# Patient Record
Sex: Female | Born: 1965
Health system: Southern US, Community
[De-identification: ages and names within clinical notes are randomized; demographics above are authoritative.]

## PROBLEM LIST (undated history)

## (undated) DIAGNOSIS — H548 Legal blindness, as defined in USA: Secondary | ICD-10-CM

## (undated) DIAGNOSIS — H3552 Pigmentary retinal dystrophy: Secondary | ICD-10-CM

## (undated) DIAGNOSIS — I10 Essential (primary) hypertension: Secondary | ICD-10-CM

## (undated) DIAGNOSIS — I4891 Unspecified atrial fibrillation: Secondary | ICD-10-CM

## (undated) DIAGNOSIS — G35 Multiple sclerosis: Secondary | ICD-10-CM

## (undated) DIAGNOSIS — I639 Cerebral infarction, unspecified: Secondary | ICD-10-CM

## (undated) HISTORY — PX: ABDOMINAL HYSTERECTOMY: SHX81

## (undated) HISTORY — PX: CYST REMOVAL NECK: SHX6281

## (undated) HISTORY — DX: Pigmentary retinal dystrophy: H35.52

## (undated) HISTORY — DX: Multiple sclerosis: G35

---

## 1999-11-22 DIAGNOSIS — G35 Multiple sclerosis: Secondary | ICD-10-CM

## 1999-11-22 HISTORY — DX: Multiple sclerosis: G35

## 2005-11-21 DIAGNOSIS — H3552 Pigmentary retinal dystrophy: Secondary | ICD-10-CM

## 2005-11-21 HISTORY — DX: Pigmentary retinal dystrophy: H35.52

## 2006-04-19 ENCOUNTER — Emergency Department: Payer: Self-pay | Admitting: Internal Medicine

## 2015-12-09 ENCOUNTER — Ambulatory Visit: Payer: Medicaid Other | Attending: Neurosurgery

## 2015-12-09 DIAGNOSIS — M5441 Lumbago with sciatica, right side: Secondary | ICD-10-CM | POA: Insufficient documentation

## 2015-12-09 NOTE — Patient Instructions (Signed)
[email or web address]  Prone press up  10x/hr (waking hours) Towel roll behind back when seated to maintain lumbar lordosis.

## 2015-12-09 NOTE — Therapy (Signed)
[facility] MAIN [facility] SERVICES [address] [place], [place], [address] Phone: [phone]   Fax:  [phone]  Physical Therapy Evaluation  Patient Details  Name: [name] MRN: [id number] Date of Birth: Aug 24, 1966 Referring Provider: Lelon Perla  Encounter Date: 12/09/2015      PT End of Session - 12/09/15 1323    Visit Number 1   Number of Visits 9   Date for PT Re-Evaluation 01/06/16   PT Start Time 0810   PT Stop Time 0905   PT Time Calculation (min) 55 min   Activity Tolerance Patient tolerated treatment well   Behavior During Therapy [facility] for tasks assessed/performed      Past Medical History  Diagnosis Date  . Multiple sclerosis (HCC) 2001  . Retinitis pigmentosa 2007    No past surgical history on file.  There were no vitals filed for this visit.  Visit Diagnosis:  Right-sided low back pain with right-sided sciatica - Plan: PT plan of care cert/re-cert      Subjective Assessment - 12/09/15 0824    Subjective pt reports she was moving / lifting things in her closet about 2 months ago. She reports she could not walk the next day due to lower back pain and severe muscle spasms. She went to the ER and they did an MRI showing buldging disc and DDD. Pt reports her pain is a little bit better now. pt reports her R leg pain extends to the middle of the posterior thigh. pt reports she did have numbness/ tingling. she reports no change in bowel or bladder, and no pain into the other leg. pt reports lifting/ pulling seem to aggrevate her leg pain, pt reports sitting bothers her leg pain more. pt reports heat makes her back feel better and lidocane patches. pt reports twisting also worsens her pain.    How long can you sit comfortably? none   How long can you stand comfortably?   Diagnostic tests pt reports buldging disc, DDD and a cystin lumbar spine   Currently in Pain? Yes   Pain Score 8    Pain Location Back   Pain  Orientation Right;Lower   Multiple Pain Sites Yes   Pain Score 8            [facility] PT Assessment - 12/09/15 8756    Assessment   Medical Diagnosis spondylosis, DDD   Referring Provider Lelon Perla   Onset Date/Surgical Date 10/09/15   Precautions   Precautions None   Precaution Comments MS   Restrictions   Weight Bearing Restrictions No   Balance Screen   Has the patient fallen in the past 6 months Yes   How many times? 1   Has the patient had a decrease in activity level because of a fear of falling?  Yes   Is the patient reluctant to leave their home because of a fear of falling?  No   Home Environment   Living Environment Private residence   Living Arrangements Alone   Available Help at Discharge Home health  2 hrs / day   Type of Home Apartment   Home Access Stairs to enter   Entrance Stairs-Number of Steps 3   Entrance Stairs-Rails Right   Home Layout One level   Prior Function   Level of Independence Independent  Jogging 6 mi daily, kick boxing 3x/week   Ambulation/Gait   Assistive device --  white cane   Gait Pattern --  mild  antalgic RLE   Ambulation Surface Level         POSTURE/OBSERVATION:   PROM/AROM: Pt has full lumbar flexion, however peripheralizes and increases LE symptoms, pt needs UE assist to return to standing  extnesion 50% loss without pain L/R sidebending WNL bilaterally with central pain bilaterally   STRENGTH:  Graded on a 0-5 scale Muscle Group Left Right  Shoulder flex    Shoulder Abd    Shoulder Ext    Shoulder IR/ER    Elbow    Wrist/hand     Hip Flex 4 4- (painful)  Hip Abd    Hip Add    Hip Ext    Hip IR/ER    Knee Flex 4 4  Knee Ext 4 4  Ankle DF 5 5  Ankle PF 4 4   SENSATION: WNL of LE  SPECIAL TESTS: (-) slump test, bilaterally FUNCTIONAL MOBILITY:  Pt is independent with bed mobility and transfers, was instructed in log roll to protect the spine.   OUTCOME MEASURES: TEST Outcome Interpretation  5  times sit<>stand 25 sec >60 yo, >15 sec indicates increased risk for falls                    McKenzie assessment: 1xlumar flexion : increased symptoms and peripheralized  posture correction: reduced pain to 6/10 Prone lying reduced pain to 4/10 in lower back and thigh Prone on elbows centralized pain to lower back only at 4/10 Mini prone press up x 10: reduced pain to 0/10 in lower back and RLE.  Pain did not return upon sitting with lumbar support  Extensive pt education regarding posture, lifting mechanics and activity modifications to reduce flexion based activities at this time.                     PT Education - 12/09/15 1322    Education provided Yes   Education Details principles of centralization/peripheralization, HEP, POC,    Person(s) Educated Patient   Methods Explanation   Comprehension Verbalized understanding             PT Long Term Goals - 12/09/15 1329    PT LONG TERM GOAL #1   Title pt will understand HEP and be compliant    Time 4   Period Weeks   Status New               Plan - 12/09/15 1323    Clinical Impression Statement pt presents with 43mo history of lower back pain with R leg pain associated that radiates down the R side. pt does not seem to have sensation changes or motor loss at this time. pt has pain increased and peripheralized with lumbar flexion based activities and responded very well to extension based positioning and exercises in Evaluation today suggesting posterior lumbar derangement with extension preference. pt had centralized LE symptoms and reduced LBP to 0/10 following session today. pt would benefit from continued skilled PT services to further address her symptoms and return to PLOF however pts current insurance plan does not cover PT treatment sessions and pt declines to pay OOP for continued services. pt was recommended to the [facility] , but she is insure if she can get there  due to transportation issue. pt will be DC after todays visit. pt encouraged to call or email with further questions/concerns.    Pt will benefit from skilled therapeutic intervention in order to improve on the  following deficits Pain;Difficulty walking;Decreased range of motion;Improper body mechanics   Rehab Potential Fair   Clinical Impairments Affecting Rehab Potential unable to to follow up with PT due to insurance limitations    PT Frequency 2x / week   PT Duration 4 weeks         Problem List There are no active problems to display for this patient.  Carlyon Shadow. Manasseh Pittsley, PT, DPT [phone]  Travor Royce 12/09/2015, 1:32 PM  [facility] MAIN [facility] SERVICES [address] [place], [place], [address] Phone: [phone]   Fax:  [phone]  Name: [name] MRN: [id number] Date of Birth: June 04, 1966

## 2016-05-30 ENCOUNTER — Emergency Department: Payer: Medicaid Other

## 2016-05-30 ENCOUNTER — Emergency Department
Admission: EM | Admit: 2016-05-30 | Discharge: 2016-05-30 | Disposition: A | Discharge status: Home / Self Care | Payer: Medicaid Other | Attending: Emergency Medicine | Admitting: Emergency Medicine

## 2016-05-30 DIAGNOSIS — S069X9A Unspecified intracranial injury with loss of consciousness of unspecified duration, initial encounter: Secondary | ICD-10-CM | POA: Insufficient documentation

## 2016-05-30 DIAGNOSIS — Y999 Unspecified external cause status: Secondary | ICD-10-CM | POA: Diagnosis not present

## 2016-05-30 DIAGNOSIS — Z79899 Other long term (current) drug therapy: Secondary | ICD-10-CM | POA: Insufficient documentation

## 2016-05-30 DIAGNOSIS — S0990XA Unspecified injury of head, initial encounter: Secondary | ICD-10-CM

## 2016-05-30 DIAGNOSIS — W1800XA Striking against unspecified object with subsequent fall, initial encounter: Secondary | ICD-10-CM | POA: Insufficient documentation

## 2016-05-30 DIAGNOSIS — R55 Syncope and collapse: Secondary | ICD-10-CM

## 2016-05-30 DIAGNOSIS — Y939 Activity, unspecified: Secondary | ICD-10-CM | POA: Diagnosis not present

## 2016-05-30 DIAGNOSIS — F17210 Nicotine dependence, cigarettes, uncomplicated: Secondary | ICD-10-CM | POA: Diagnosis not present

## 2016-05-30 DIAGNOSIS — Y929 Unspecified place or not applicable: Secondary | ICD-10-CM | POA: Insufficient documentation

## 2016-05-30 DIAGNOSIS — E876 Hypokalemia: Secondary | ICD-10-CM | POA: Insufficient documentation

## 2016-05-30 LAB — TROPONIN I: Troponin I: <0.03 ng/mL (ref <0.03)

## 2016-05-30 LAB — BASIC METABOLIC PANEL
ANION GAP: 9 (ref 5–15)
BUN: 23 mg/dL — ABNORMAL HIGH (ref 6–20)
CALCIUM: 9.5 mg/dL (ref 8.9–10.3)
CHLORIDE: 99 mmol/L — AB (ref 101–111)
CO2: 28 mmol/L (ref 22–32)
CREATININE: 0.75 mg/dL (ref 0.44–1.00)
GFR calc non Af Amer: >60 mL/min (ref >60)
Glucose, Bld: 92 mg/dL (ref 65–99)
Potassium: 2.7 mmol/L — CL (ref 3.5–5.1)
SODIUM: 136 mmol/L (ref 135–145)

## 2016-05-30 LAB — CBC
HCT: 41.2 % (ref 35.0–47.0)
HEMOGLOBIN: 14.4 g/dL (ref 12.0–16.0)
MCH: 30.4 pg (ref 26.0–34.0)
MCHC: 34.9 g/dL (ref 32.0–36.0)
MCV: 87.3 fL (ref 80.0–100.0)
PLATELETS: 143 10*3/uL — AB (ref 150–440)
RBC: 4.71 MIL/uL (ref 3.80–5.20)
RDW: 14.5 % (ref 11.5–14.5)
WBC: 4.6 10*3/uL (ref 3.6–11.0)

## 2016-05-30 MED ORDER — SODIUM CHLORIDE 0.9 % IV BOLUS (SEPSIS)
1000.0000 mL | Freq: Once | INTRAVENOUS | Status: AC
  Administered 2016-05-30: 1000 mL via INTRAVENOUS

## 2016-05-30 MED ORDER — POTASSIUM CHLORIDE CRYS ER 20 MEQ PO TBCR
40.0000 meq | EXTENDED_RELEASE_TABLET | Freq: Once | ORAL | Status: AC
  Administered 2016-05-30: 40 meq via ORAL
  Filled 2016-05-30: qty 2

## 2016-05-30 MED ORDER — ONDANSETRON HCL 4 MG/2ML IJ SOLN
4.0000 mg | Freq: Once | INTRAMUSCULAR | Status: AC
  Administered 2016-05-30: 4 mg via INTRAVENOUS
  Filled 2016-05-30: qty 2

## 2016-05-30 MED ORDER — POTASSIUM CHLORIDE ER 10 MEQ PO TBCR: 40.0000 meq | EXTENDED_RELEASE_TABLET | Freq: Every day | ORAL | Status: DC

## 2016-05-30 NOTE — ED Notes (Addendum)
Pt fell yesterday during a syncopal episode that her neighbor noticed - when she fell she hit her head on the dryer and then the floor - after the fall she had to be assisted to her feet and had difficulty walking - since the fall she has had a headache and neck pain - pt reports muscle spasms and weakness in her legs but she also has a dx of MS

## 2016-05-30 NOTE — ED Notes (Signed)
Pt is aware of need for urine sample

## 2016-05-30 NOTE — ED Notes (Addendum)
Pt states she had a syncople episode witnessed by neighbor who states the pt went unconscious and fell back hitting the back of her head on the dryer.. States today she is having intermittent HA with nausea..pt is legally blind

## 2016-05-30 NOTE — ED Notes (Signed)
Lab called with critical K+ of 2.7 - Reported to Dr Pershing Proud

## 2016-05-30 NOTE — ED Provider Notes (Signed)
[facility] Emergency Department Provider Note   ____________________________________________  Time seen: Approximately 510 PM  I have reviewed the triage vital signs and the nursing notes.   HISTORY  Chief Complaint Head Injury and Loss of Consciousness   HPI [name] is a 50 y.o. female with multiple sclerosis as well as blindness who is presenting after syncopal episode yesterday. She says that she passed out after standing up last night. She says that the episode was witnessed by her neighbor who saw her hit the back of her head on a dryer and then hit the ground. The patient does not know how long she was unconscious for. The witness did not tell her that she was seizure. She did not lose bowel or bladder continence. The patient has had lightheadedness ever since. She said the preceding episode she felt lightheaded as well as hot. She said that she has never passed out before. She says that she does have a history of a "heart attack." Denies any chest pain. Denies any shortness of breath.Says that she has been nauseous lately which is not typical for her. Says that she has chronic diarrhea but has been having more than normal. Denies any abdominal pain.   Past Medical History  Diagnosis Date  . Multiple sclerosis ([place]) 2001  . Retinitis pigmentosa 2007    There are no active problems to display for this patient.   Past Surgical History  Procedure Laterality Date  . Abdominal hysterectomy    . Cyst removal neck      spine    Current Outpatient Rx  Name  Route  Sig  Dispense  Refill  . amLODipine (NORVASC) 10 MG tablet   Oral   Take 10 mg by mouth daily.         . clonazePAM (KLONOPIN) 0.5 MG tablet   Oral   Take 0.5 mg by mouth 2 (two) times daily as needed for anxiety.         . docusate sodium (COLACE) 100 MG capsule   Oral   Take 100 mg by mouth 2 (two) times daily.         Marland Kitchen gabapentin (NEURONTIN) 300 MG capsule   Oral  Take 300 mg by mouth 4 (four) times daily.         . hydrochlorothiazide (HYDRODIURIL) 25 MG tablet   Oral   Take 25 mg by mouth daily.         . Interferon Beta-1b (BETASERON) 0.3 MG KIT injection   Subcutaneous   Inject 0.3 mg into the skin every other day.         . meloxicam (MOBIC) 15 MG tablet   Oral   Take 15 mg by mouth daily.         . modafinil (PROVIGIL) 100 MG tablet   Oral   Take 100 mg by mouth 2 (two) times daily.         . potassium chloride (K-DUR,KLOR-CON) 10 MEQ tablet   Oral   Take 10 mEq by mouth once.         . senna (SENOKOT) 8.6 MG tablet   Oral   Take 2 tablets by mouth daily.         Marland Kitchen tiZANidine (ZANAFLEX) 4 MG tablet   Oral   Take 4 mg by mouth every 6 (six) hours as needed for muscle spasms.         . traZODone (DESYREL) 150 MG tablet  Oral   Take 300 mg by mouth at bedtime.            Allergies Carbamazepine  No family history on file.  Social History Social History  Substance Use Topics  . Smoking status: Current Every Day Smoker -- 0.25 packs/day    Types: Cigarettes  . Smokeless tobacco: None  . Alcohol Use: No    Review of Systems Constitutional: No fever/chills Eyes: No visual changes. ENT: No sore throat. Cardiovascular: Denies chest pain. Respiratory: Denies shortness of breath. Gastrointestinal: No abdominal pain.  no vomiting.  No diarrhea.  No constipation. Genitourinary: Negative for dysuria. Musculoskeletal: Negative for back pain. Skin: Negative for rash. Neurological: Negative for headaches, focal weakness or numbness.  10-point ROS otherwise negative.  ____________________________________________   PHYSICAL EXAM:  VITAL SIGNS: ED Triage Vitals  Enc Vitals Group     BP 05/30/16 1827 172/103 mmHg     Pulse Rate 05/30/16 1827 90     Resp 05/30/16 1827 18     Temp 05/30/16 1827 98.3 F (36.8 C)     Temp Source 05/30/16 1827 Oral     SpO2 05/30/16 1827 100 %     Weight 05/30/16  1828 156 lb (70.761 kg)     Height 05/30/16 1828 5' (1.524 m)     Head Cir --      Peak Flow --      Pain Score 05/30/16 1828 8     Pain Loc --      Pain Edu? --      Excl. in [place]? --     Constitutional: Alert and oriented. Well appearing and in no acute distress. Eyes: Conjunctivae are normal. PERRL. EOMI. Head: Atraumatic. Nose: No congestion/rhinnorhea. Mouth/Throat: Mucous membranes are moist.   Neck: No stridor.   Cardiovascular: Normal rate, regular rhythm. Grossly normal heart sounds.  Good peripheral circulation. Respiratory: Normal respiratory effort.  No retractions. Lungs CTAB. Gastrointestinal: Soft with mild diffuse tenderness which the patient says is chronic. No distention. Musculoskeletal: No lower extremity tenderness nor edema.  No joint effusions. Neurologic:  Normal speech and language. No gross focal neurologic deficits are appreciated.  Skin:  Skin is warm, dry and intact. No rash noted. Psychiatric: Mood and affect are normal. Speech and behavior are normal.  ____________________________________________   LABS (all labs ordered are listed, but only abnormal results are displayed)  Labs Reviewed  BASIC METABOLIC PANEL - Abnormal; Notable for the following:    Potassium 2.7 (*)    Chloride 99 (*)    BUN 23 (*)    All other components within normal limits  CBC - Abnormal; Notable for the following:    Platelets 143 (*)    All other components within normal limits  TROPONIN I  TROPONIN I  URINALYSIS COMPLETEWITH MICROSCOPIC ([facility] ONLY)  CBG MONITORING, ED   ____________________________________________  EKG  ED ECG REPORT I, Doran Stabler, the attending physician, personally viewed and interpreted this ECG.   Date: 05/30/2016  EKG Time: 1830  Rate: 77  Rhythm: normal sinus rhythm  Axis: Normal  Intervals:none  ST&T Change: T wave inversions in 1, 2, aVL as well as V3 through V6. Mild ST depression in V4 through V6. No previous for  comparison.  EKG from 2011 with the following read: NORMAL SINUS RHYTHM MINIMAL VOLTAGE CRITERIA FOR LVH, MAY BE NORMAL VARIANT ST& T WAVE ABNORMALITY, CONSIDER INFERIOR ISCHEMIA ST& T WAVE ABNORMALITY, CONSIDER ANTEROLATERAL ISCHEMIA ABNORMAL ECG WHEN COMPARED WITH ECG OF  07-Jan-2010 13:22, NO SIGNIFICANT CHANGE WAS FOUND Confirmed by Tamala Julian  MD, Webb ([id number]) on 03/24/2010 3:39:11 AM  Although I do not have an image to compare to this read appears to be consistent with what I'm seeing on the patient's current EKG. ____________________________________________  RADIOLOGY     CT Head Wo Contrast (Final result) Result time: 05/30/16 21:41:17   Final result by Rad Results In Interface (05/30/16 21:41:17)   Narrative:   CLINICAL DATA: Syncopal episode with fall. Struck back of head. Intermittent headache today with nausea.  EXAM: CT HEAD WITHOUT CONTRAST  TECHNIQUE: Contiguous axial images were obtained from the base of the skull through the vertex without intravenous contrast.  COMPARISON: None.  FINDINGS: Ventricles and sulci appear symmetrical. No ventricular dilatation. No mass effect or midline shift. No abnormal extra-axial fluid collections. Gray-white matter junctions are distinct. Basal cisterns are not effaced. No evidence of acute intracranial hemorrhage. No depressed skull fractures. Visualized paranasal sinuses and mastoid air cells are not opacified.  IMPRESSION: No acute intracranial abnormalities.   Electronically Signed By: Lucienne Capers M.D. On: 05/30/2016 21:41    ____________________________________________   PROCEDURES   Procedures   ____________________________________________   INITIAL IMPRESSION / ASSESSMENT AND PLAN / ED COURSE  Pertinent labs & imaging results that were available during my care of the patient were reviewed by me and considered in my medical decision making (see chart for  details).  ----------------------------------------- 11:35 PM on 05/30/2016 -----------------------------------------  Patient says that she feels relieved after fluids Zofran and potassium. No longer any lightheadedness. Her history is consistent with a vasovagal episode. She says that she was out in the heat for a lot of the day yesterday and she knows that she is not supposed to be on the because of her MS. However, she says that she has been out of the heat for about a month at this time. Given the history as well as her past medical problems. It is likely that she became orthostatic. I will also give her an additional dose potassium to go home with. She will take the prescribed dose tomorrow and then resume her regular standing dose the day after. She understands this plan and is one to comply. She'll be following up with her primary care doctor at [facility] for further evaluation and treatment. Her EKG is abnormal but she has had 2 negative troponins. I was unable to find a picture of her previous EKG. However, the description on the previous record is consistent with what I'm seeing on today's tracing. Patient understands that she should stay out of the heat and stay hydrated. ____________________________________________   FINAL CLINICAL IMPRESSION(S) / ED DIAGNOSES  Syncope. Head injury.    NEW MEDICATIONS STARTED DURING THIS VISIT:  New Prescriptions   No medications on file     Note:  This document was prepared using Dragon voice recognition software and may include unintentional dictation errors.    Orbie Pyo, MD 05/30/16 [phone]

## 2016-12-03 ENCOUNTER — Emergency Department
Admission: EM | Admit: 2016-12-03 | Discharge: 2016-12-03 | Disposition: A | Discharge status: Home / Self Care | Payer: Medicaid Other | Attending: Emergency Medicine | Admitting: Emergency Medicine

## 2016-12-03 ENCOUNTER — Emergency Department: Payer: Medicaid Other

## 2016-12-03 ENCOUNTER — Encounter: Payer: Self-pay | Admitting: Emergency Medicine

## 2016-12-03 DIAGNOSIS — R51 Headache: Secondary | ICD-10-CM | POA: Diagnosis not present

## 2016-12-03 DIAGNOSIS — S161XXA Strain of muscle, fascia and tendon at neck level, initial encounter: Secondary | ICD-10-CM | POA: Insufficient documentation

## 2016-12-03 DIAGNOSIS — Y999 Unspecified external cause status: Secondary | ICD-10-CM | POA: Insufficient documentation

## 2016-12-03 DIAGNOSIS — S199XXA Unspecified injury of neck, initial encounter: Secondary | ICD-10-CM | POA: Diagnosis present

## 2016-12-03 DIAGNOSIS — Y939 Activity, unspecified: Secondary | ICD-10-CM | POA: Insufficient documentation

## 2016-12-03 DIAGNOSIS — Y92481 Parking lot as the place of occurrence of the external cause: Secondary | ICD-10-CM | POA: Insufficient documentation

## 2016-12-03 DIAGNOSIS — F17210 Nicotine dependence, cigarettes, uncomplicated: Secondary | ICD-10-CM | POA: Insufficient documentation

## 2016-12-03 DIAGNOSIS — Z79899 Other long term (current) drug therapy: Secondary | ICD-10-CM | POA: Diagnosis not present

## 2016-12-03 MED ORDER — ACETAMINOPHEN 325 MG PO TABS
650.0000 mg | ORAL_TABLET | Freq: Once | ORAL | Status: AC
  Administered 2016-12-03: 650 mg via ORAL
  Filled 2016-12-03: qty 2
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
## 2017-01-16 ENCOUNTER — Observation Stay (HOSPITAL_COMMUNITY): Payer: Medicaid Other

## 2017-01-16 ENCOUNTER — Emergency Department (HOSPITAL_COMMUNITY): Payer: Medicaid Other

## 2017-01-16 ENCOUNTER — Inpatient Hospital Stay (HOSPITAL_COMMUNITY)
Admission: EM | Admit: 2017-01-16 | Discharge: 2017-01-20 | DRG: 084 | Disposition: A | Discharge status: Rehabilitation Facility | Payer: Medicaid Other | Attending: General Surgery | Admitting: General Surgery

## 2017-01-16 ENCOUNTER — Encounter (HOSPITAL_COMMUNITY): Payer: Self-pay | Admitting: *Deleted

## 2017-01-16 DIAGNOSIS — I10 Essential (primary) hypertension: Secondary | ICD-10-CM | POA: Diagnosis present

## 2017-01-16 DIAGNOSIS — R402241 Coma scale, best verbal response, confused conversation, in the field [EMT or ambulance]: Secondary | ICD-10-CM | POA: Diagnosis present

## 2017-01-16 DIAGNOSIS — S066X9A Traumatic subarachnoid hemorrhage with loss of consciousness of unspecified duration, initial encounter: Principal | ICD-10-CM | POA: Diagnosis present

## 2017-01-16 DIAGNOSIS — H548 Legal blindness, as defined in USA: Secondary | ICD-10-CM

## 2017-01-16 DIAGNOSIS — Z781 Physical restraint status: Secondary | ICD-10-CM

## 2017-01-16 DIAGNOSIS — G8918 Other acute postprocedural pain: Secondary | ICD-10-CM

## 2017-01-16 DIAGNOSIS — K5901 Slow transit constipation: Secondary | ICD-10-CM

## 2017-01-16 DIAGNOSIS — I609 Nontraumatic subarachnoid hemorrhage, unspecified: Secondary | ICD-10-CM

## 2017-01-16 DIAGNOSIS — Z87891 Personal history of nicotine dependence: Secondary | ICD-10-CM

## 2017-01-16 DIAGNOSIS — S025XXA Fracture of tooth (traumatic), initial encounter for closed fracture: Secondary | ICD-10-CM | POA: Diagnosis present

## 2017-01-16 DIAGNOSIS — R413 Other amnesia: Secondary | ICD-10-CM | POA: Diagnosis present

## 2017-01-16 DIAGNOSIS — G35 Multiple sclerosis: Secondary | ICD-10-CM | POA: Diagnosis present

## 2017-01-16 DIAGNOSIS — S82832A Other fracture of upper and lower end of left fibula, initial encounter for closed fracture: Secondary | ICD-10-CM | POA: Diagnosis present

## 2017-01-16 DIAGNOSIS — Y92410 Unspecified street and highway as the place of occurrence of the external cause: Secondary | ICD-10-CM

## 2017-01-16 DIAGNOSIS — S01511A Laceration without foreign body of lip, initial encounter: Secondary | ICD-10-CM

## 2017-01-16 DIAGNOSIS — R402141 Coma scale, eyes open, spontaneous, in the field [EMT or ambulance]: Secondary | ICD-10-CM | POA: Diagnosis present

## 2017-01-16 DIAGNOSIS — R402361 Coma scale, best motor response, obeys commands, in the field [EMT or ambulance]: Secondary | ICD-10-CM | POA: Diagnosis present

## 2017-01-16 DIAGNOSIS — F411 Generalized anxiety disorder: Secondary | ICD-10-CM | POA: Diagnosis present

## 2017-01-16 DIAGNOSIS — J690 Pneumonitis due to inhalation of food and vomit: Secondary | ICD-10-CM

## 2017-01-16 DIAGNOSIS — T1490XA Injury, unspecified, initial encounter: Secondary | ICD-10-CM

## 2017-01-16 HISTORY — DX: Essential (primary) hypertension: I10

## 2017-01-16 HISTORY — DX: Legal blindness, as defined in USA: H54.8

## 2017-01-16 LAB — I-STAT CG4 LACTIC ACID, ED: Lactic Acid, Venous: 2.18 mmol/L (ref 0.5–1.9)

## 2017-01-16 LAB — URINALYSIS, ROUTINE W REFLEX MICROSCOPIC
Bilirubin Urine: NEGATIVE
GLUCOSE, UA: 50 mg/dL — AB
Ketones, ur: NEGATIVE mg/dL
Leukocytes, UA: NEGATIVE
Nitrite: NEGATIVE
PH: 7 (ref 5.0–8.0)
PROTEIN: 30 mg/dL — AB
Specific Gravity, Urine: 1.01 (ref 1.005–1.030)

## 2017-01-16 LAB — COMPREHENSIVE METABOLIC PANEL
ALK PHOS: 69 U/L (ref 38–126)
ALT: 26 U/L (ref 14–54)
AST: 44 U/L — AB (ref 15–41)
Albumin: 4.2 g/dL (ref 3.5–5.0)
Anion gap: 13 (ref 5–15)
BUN: 12 mg/dL (ref 6–20)
CALCIUM: 10.1 mg/dL (ref 8.9–10.3)
CO2: 30 mmol/L (ref 22–32)
Chloride: 97 mmol/L — ABNORMAL LOW (ref 101–111)
Creatinine, Ser: 0.73 mg/dL (ref 0.44–1.00)
GFR calc Af Amer: >60 mL/min (ref >60)
GFR calc non Af Amer: >60 mL/min (ref >60)
GLUCOSE: 104 mg/dL — AB (ref 65–99)
Potassium: 3 mmol/L — ABNORMAL LOW (ref 3.5–5.1)
SODIUM: 140 mmol/L (ref 135–145)
Total Bilirubin: 0.5 mg/dL (ref 0.3–1.2)
Total Protein: 8.1 g/dL (ref 6.5–8.1)

## 2017-01-16 LAB — PROTIME-INR
INR: 0.9
PROTHROMBIN TIME: 12.2 s (ref 11.4–15.2)

## 2017-01-16 LAB — I-STAT CHEM 8, ED
BUN: 13 mg/dL (ref 6–20)
CHLORIDE: 99 mmol/L — AB (ref 101–111)
Calcium, Ion: 1.14 mmol/L — ABNORMAL LOW (ref 1.15–1.40)
Creatinine, Ser: 0.7 mg/dL (ref 0.44–1.00)
GLUCOSE: 103 mg/dL — AB (ref 65–99)
HCT: 45 % (ref 36.0–46.0)
Hemoglobin: 15.3 g/dL — ABNORMAL HIGH (ref 12.0–15.0)
POTASSIUM: 2.9 mmol/L — AB (ref 3.5–5.1)
Sodium: 142 mmol/L (ref 135–145)
TCO2: 34 mmol/L (ref 0–100)

## 2017-01-16 LAB — CBC
HCT: 42.8 % (ref 36.0–46.0)
Hemoglobin: 14.2 g/dL (ref 12.0–15.0)
MCH: 29.4 pg (ref 26.0–34.0)
MCHC: 33.2 g/dL (ref 30.0–36.0)
MCV: 88.6 fL (ref 78.0–100.0)
PLATELETS: 167 10*3/uL (ref 150–400)
RBC: 4.83 MIL/uL (ref 3.87–5.11)
RDW: 13.9 % (ref 11.5–15.5)
WBC: 5 10*3/uL (ref 4.0–10.5)

## 2017-01-16 LAB — SAMPLE TO BLOOD BANK

## 2017-01-16 LAB — I-STAT BETA HCG BLOOD, ED (MC, WL, AP ONLY): HCG, QUANTITATIVE: 9 m[IU]/mL — AB (ref <5)

## 2017-01-16 LAB — POC URINE PREG, ED: PREG TEST UR: NEGATIVE

## 2017-01-16 LAB — ETHANOL

## 2017-01-16 MED ORDER — BISACODYL 10 MG RE SUPP: 10.0000 mg | Freq: Every day | RECTAL | Status: DC | PRN

## 2017-01-16 MED ORDER — ONDANSETRON HCL 4 MG/2ML IJ SOLN
4.0000 mg | Freq: Four times a day (QID) | INTRAMUSCULAR | Status: DC | PRN
  Administered 2017-01-16: 4 mg via INTRAVENOUS
  Filled 2017-01-16: qty 2

## 2017-01-16 MED ORDER — PANTOPRAZOLE SODIUM 40 MG IV SOLR
40.0000 mg | Freq: Every day | INTRAVENOUS | Status: DC
  Administered 2017-01-16 – 2017-01-18 (×3): 40 mg via INTRAVENOUS
  Filled 2017-01-16 (×4): qty 40

## 2017-01-16 MED ORDER — SODIUM CHLORIDE 0.9 % IV SOLN
INTRAVENOUS | Status: DC
  Administered 2017-01-16: 75 mL/h via INTRAVENOUS
  Administered 2017-01-18: 13:00:00 via INTRAVENOUS

## 2017-01-16 MED ORDER — FENTANYL CITRATE (PF) 100 MCG/2ML IJ SOLN
INTRAMUSCULAR | Status: AC | PRN
  Administered 2017-01-16: 50 ug via INTRAVENOUS

## 2017-01-16 MED ORDER — FENTANYL CITRATE (PF) 100 MCG/2ML IJ SOLN
50.0000 ug | INTRAMUSCULAR | Status: DC | PRN
  Administered 2017-01-17: 50 ug via INTRAVENOUS
  Filled 2017-01-16: qty 2

## 2017-01-16 MED ORDER — FENTANYL CITRATE (PF) 100 MCG/2ML IJ SOLN
INTRAMUSCULAR | Status: AC
  Filled 2017-01-16: qty 2

## 2017-01-16 MED ORDER — LIDOCAINE HCL (PF) 1 % IJ SOLN
5.0000 mL | Freq: Once | INTRAMUSCULAR | Status: AC
  Administered 2017-01-16: 5 mL
  Filled 2017-01-16: qty 5

## 2017-01-16 MED ORDER — OXYCODONE HCL 5 MG PO TABS
5.0000 mg | ORAL_TABLET | ORAL | Status: DC | PRN
  Administered 2017-01-20: 5 mg via ORAL
  Filled 2017-01-16: qty 1

## 2017-01-16 MED ORDER — DOCUSATE SODIUM 100 MG PO CAPS
100.0000 mg | ORAL_CAPSULE | Freq: Two times a day (BID) | ORAL | Status: DC
  Administered 2017-01-18 – 2017-01-20 (×5): 100 mg via ORAL
  Filled 2017-01-16 (×5): qty 1

## 2017-01-16 MED ORDER — PANTOPRAZOLE SODIUM 40 MG PO TBEC
40.0000 mg | DELAYED_RELEASE_TABLET | Freq: Every day | ORAL | Status: DC
  Administered 2017-01-19 – 2017-01-20 (×2): 40 mg via ORAL
  Filled 2017-01-16 (×2): qty 1

## 2017-01-16 MED ORDER — ACETAMINOPHEN 325 MG PO TABS: 650.0000 mg | ORAL_TABLET | ORAL | Status: DC | PRN

## 2017-01-16 MED ORDER — MORPHINE SULFATE (PF) 4 MG/ML IV SOLN
1.0000 mg | INTRAVENOUS | Status: DC | PRN
  Administered 2017-01-16 (×2): 2 mg via INTRAVENOUS
  Filled 2017-01-16 (×2): qty 1

## 2017-01-16 MED ORDER — ONDANSETRON HCL 4 MG PO TABS: 4.0000 mg | ORAL_TABLET | Freq: Four times a day (QID) | ORAL | Status: DC | PRN
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
## 2017-01-16 NOTE — ED Notes (Signed)
EDP at bedside suturing mouth.

## 2017-01-16 NOTE — ED Notes (Signed)
Pt to CT and X-ray per Dr Jacqulyn Bath.
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
## 2017-01-16 NOTE — ED Notes (Signed)
TRAUMA END °

## 2017-01-16 NOTE — ED Notes (Signed)
Pt remains increasingly agitated, despite pain meds.  Was sent back from X-ray b/c she would not allow them to take images. Pt attempting to get out of bed, turning on her stomach. Neuro PA paged and responded.

## 2017-01-16 NOTE — Progress Notes (Signed)
   01/16/17 1345  Clinical Encounter Type  Visited With Health care provider  Visit Type ED  Spiritual Encounters  Spiritual Needs Emotional  Stress Factors  Patient Stress Factors Not reviewed  Responded to page. Pt under sedation. Medical staff reported Pt stated no family.

## 2017-01-16 NOTE — ED Notes (Addendum)
I-stat hcg positive.  Pt stated to this RN that she had not menstruated x 2 years.  She stated to radiology staff her last menstrual cycle was last week.  u preg ordered.

## 2017-01-16 NOTE — ED Notes (Signed)
Dr Pool at bedside 
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
## 2017-01-16 NOTE — Progress Notes (Signed)
Received call that patient was agitated. She was seen and examined. Neurologically still intact. Able to move all extremities. Follows commands. PERRL. She is going to be admitted soon. Once on floor, continue neuro checks q 1 hour Repeat scan as planned, sooner as needed.

## 2017-01-16 NOTE — ED Notes (Signed)
Paged Pool/neurosurgery

## 2017-01-16 NOTE — ED Notes (Signed)
Neuro at bedside.

## 2017-01-16 NOTE — ED Notes (Signed)
Dr Wyatt at bedside.  
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
## 2017-01-16 NOTE — ED Notes (Signed)
Neuro surgery at bedside.

## 2017-01-16 NOTE — Progress Notes (Signed)
Pt arrived form ED. Did not want to answer this RN questions. Stated, "I just want to rest". Reviewed Rules of ICU and attempted to orient pt to unit. Family on phone, pt declines this RN to give information out to father.  BF at bedside, did a brief update on pt's condition and plan of care. Orient to unit, belongings, and discussed pt's history .

## 2017-01-17 ENCOUNTER — Encounter (HOSPITAL_COMMUNITY): Payer: Self-pay | Admitting: *Deleted

## 2017-01-17 ENCOUNTER — Observation Stay (HOSPITAL_COMMUNITY): Payer: Medicaid Other

## 2017-01-17 DIAGNOSIS — S069X3S Unspecified intracranial injury with loss of consciousness of 1 hour to 5 hours 59 minutes, sequela: Secondary | ICD-10-CM | POA: Diagnosis not present

## 2017-01-17 DIAGNOSIS — G35 Multiple sclerosis: Secondary | ICD-10-CM | POA: Diagnosis present

## 2017-01-17 DIAGNOSIS — S82832A Other fracture of upper and lower end of left fibula, initial encounter for closed fracture: Secondary | ICD-10-CM | POA: Diagnosis present

## 2017-01-17 DIAGNOSIS — R52 Pain, unspecified: Secondary | ICD-10-CM | POA: Diagnosis not present

## 2017-01-17 DIAGNOSIS — S069X9A Unspecified intracranial injury with loss of consciousness of unspecified duration, initial encounter: Secondary | ICD-10-CM | POA: Diagnosis not present

## 2017-01-17 DIAGNOSIS — N179 Acute kidney failure, unspecified: Secondary | ICD-10-CM | POA: Diagnosis not present

## 2017-01-17 DIAGNOSIS — F411 Generalized anxiety disorder: Secondary | ICD-10-CM | POA: Diagnosis present

## 2017-01-17 DIAGNOSIS — H548 Legal blindness, as defined in USA: Secondary | ICD-10-CM | POA: Diagnosis present

## 2017-01-17 DIAGNOSIS — R402241 Coma scale, best verbal response, confused conversation, in the field [EMT or ambulance]: Secondary | ICD-10-CM | POA: Diagnosis present

## 2017-01-17 DIAGNOSIS — S069X2S Unspecified intracranial injury with loss of consciousness of 31 minutes to 59 minutes, sequela: Secondary | ICD-10-CM | POA: Diagnosis not present

## 2017-01-17 DIAGNOSIS — G441 Vascular headache, not elsewhere classified: Secondary | ICD-10-CM | POA: Diagnosis not present

## 2017-01-17 DIAGNOSIS — S065X9D Traumatic subdural hemorrhage with loss of consciousness of unspecified duration, subsequent encounter: Secondary | ICD-10-CM | POA: Diagnosis not present

## 2017-01-17 DIAGNOSIS — N390 Urinary tract infection, site not specified: Secondary | ICD-10-CM | POA: Diagnosis not present

## 2017-01-17 DIAGNOSIS — A499 Bacterial infection, unspecified: Secondary | ICD-10-CM | POA: Diagnosis not present

## 2017-01-17 DIAGNOSIS — S025XXA Fracture of tooth (traumatic), initial encounter for closed fracture: Secondary | ICD-10-CM | POA: Diagnosis present

## 2017-01-17 DIAGNOSIS — I10 Essential (primary) hypertension: Secondary | ICD-10-CM | POA: Diagnosis present

## 2017-01-17 DIAGNOSIS — I609 Nontraumatic subarachnoid hemorrhage, unspecified: Secondary | ICD-10-CM | POA: Diagnosis present

## 2017-01-17 DIAGNOSIS — K5901 Slow transit constipation: Secondary | ICD-10-CM | POA: Diagnosis not present

## 2017-01-17 DIAGNOSIS — S069X3A Unspecified intracranial injury with loss of consciousness of 1 hour to 5 hours 59 minutes, initial encounter: Secondary | ICD-10-CM | POA: Diagnosis not present

## 2017-01-17 DIAGNOSIS — Z781 Physical restraint status: Secondary | ICD-10-CM | POA: Diagnosis not present

## 2017-01-17 DIAGNOSIS — R402361 Coma scale, best motor response, obeys commands, in the field [EMT or ambulance]: Secondary | ICD-10-CM | POA: Diagnosis present

## 2017-01-17 DIAGNOSIS — Y92410 Unspecified street and highway as the place of occurrence of the external cause: Secondary | ICD-10-CM | POA: Diagnosis not present

## 2017-01-17 DIAGNOSIS — G8918 Other acute postprocedural pain: Secondary | ICD-10-CM | POA: Diagnosis not present

## 2017-01-17 DIAGNOSIS — S066X9A Traumatic subarachnoid hemorrhage with loss of consciousness of unspecified duration, initial encounter: Secondary | ICD-10-CM | POA: Diagnosis present

## 2017-01-17 DIAGNOSIS — S01511A Laceration without foreign body of lip, initial encounter: Secondary | ICD-10-CM | POA: Diagnosis present

## 2017-01-17 DIAGNOSIS — R413 Other amnesia: Secondary | ICD-10-CM | POA: Diagnosis present

## 2017-01-17 DIAGNOSIS — R402141 Coma scale, eyes open, spontaneous, in the field [EMT or ambulance]: Secondary | ICD-10-CM | POA: Diagnosis present

## 2017-01-17 DIAGNOSIS — M792 Neuralgia and neuritis, unspecified: Secondary | ICD-10-CM | POA: Diagnosis not present

## 2017-01-17 DIAGNOSIS — S069X0S Unspecified intracranial injury without loss of consciousness, sequela: Secondary | ICD-10-CM | POA: Diagnosis not present

## 2017-01-17 DIAGNOSIS — Z87891 Personal history of nicotine dependence: Secondary | ICD-10-CM | POA: Diagnosis not present

## 2017-01-17 LAB — GLUCOSE, CAPILLARY: Glucose-Capillary: 111 mg/dL — ABNORMAL HIGH (ref 65–99)

## 2017-01-17 LAB — BASIC METABOLIC PANEL
Anion gap: 9 (ref 5–15)
Anion gap: 9 (ref 5–15)
BUN: 12 mg/dL (ref 6–20)
BUN: 12 mg/dL (ref 6–20)
CALCIUM: 8.8 mg/dL — AB (ref 8.9–10.3)
CHLORIDE: 101 mmol/L (ref 101–111)
CO2: 30 mmol/L (ref 22–32)
CO2: 27 mmol/L (ref 22–32)
CREATININE: 0.57 mg/dL (ref 0.44–1.00)
CREATININE: 0.61 mg/dL (ref 0.44–1.00)
Calcium: 9.3 mg/dL (ref 8.9–10.3)
Chloride: 105 mmol/L (ref 101–111)
GFR calc Af Amer: >60 mL/min (ref >60)
GLUCOSE: 112 mg/dL — AB (ref 65–99)
Glucose, Bld: 108 mg/dL — ABNORMAL HIGH (ref 65–99)
POTASSIUM: 3.2 mmol/L — AB (ref 3.5–5.1)
Potassium: 2.6 mmol/L — CL (ref 3.5–5.1)
SODIUM: 140 mmol/L (ref 135–145)
Sodium: 141 mmol/L (ref 135–145)

## 2017-01-17 LAB — CBC
HCT: 34.6 % — ABNORMAL LOW (ref 36.0–46.0)
HEMOGLOBIN: 11.4 g/dL — AB (ref 12.0–15.0)
MCH: 29 pg (ref 26.0–34.0)
MCHC: 32.9 g/dL (ref 30.0–36.0)
MCV: 88 fL (ref 78.0–100.0)
PLATELETS: 179 10*3/uL (ref 150–400)
RBC: 3.93 MIL/uL (ref 3.87–5.11)
RDW: 13.4 % (ref 11.5–15.5)
WBC: 7.1 10*3/uL (ref 4.0–10.5)

## 2017-01-17 LAB — MRSA PCR SCREENING: MRSA BY PCR: NEGATIVE

## 2017-01-17 MED ORDER — MORPHINE SULFATE (PF) 2 MG/ML IV SOLN
2.0000 mg | INTRAVENOUS | Status: DC | PRN
  Administered 2017-01-17: 4 mg via INTRAVENOUS
  Administered 2017-01-17 (×2): 2 mg via INTRAVENOUS
  Administered 2017-01-18 (×2): 4 mg via INTRAVENOUS
  Filled 2017-01-17 (×5): qty 2

## 2017-01-17 MED ORDER — SODIUM CHLORIDE 0.9 % IV SOLN
30.0000 meq | Freq: Once | INTRAVENOUS | Status: AC
  Administered 2017-01-17: 30 meq via INTRAVENOUS
  Filled 2017-01-17: qty 15

## 2017-01-17 MED ORDER — SODIUM CHLORIDE 0.9 % IV SOLN
30.0000 meq | Freq: Once | INTRAVENOUS | Status: DC
  Filled 2017-01-17: qty 15

## 2017-01-17 MED ORDER — DEXMEDETOMIDINE HCL IN NACL 200 MCG/50ML IV SOLN
0.4000 ug/kg/h | INTRAVENOUS | Status: DC
  Administered 2017-01-17: 1 ug/kg/h via INTRAVENOUS
  Administered 2017-01-17: 0.8 ug/kg/h via INTRAVENOUS
  Administered 2017-01-17: 1.2 ug/kg/h via INTRAVENOUS
  Administered 2017-01-17: 0.9 ug/kg/h via INTRAVENOUS
  Administered 2017-01-17: 0.6 ug/kg/h via INTRAVENOUS
  Administered 2017-01-18 (×2): 1 ug/kg/h via INTRAVENOUS
  Administered 2017-01-18: 0.4 ug/kg/h via INTRAVENOUS
  Administered 2017-01-18: 1 ug/kg/h via INTRAVENOUS
  Filled 2017-01-17 (×10): qty 50

## 2017-01-17 MED ORDER — BOOST / RESOURCE BREEZE PO LIQD
1.0000 | Freq: Three times a day (TID) | ORAL | Status: DC
  Administered 2017-01-18 – 2017-01-20 (×3): 1 via ORAL

## 2017-01-17 NOTE — Progress Notes (Signed)
Pt sitting up in bed, attempting to get up. Pt states "I have to pee". Attempt to have pt use female urinal and bedpan. Pt continues to attempt to get OOB. Re-orient pt, with no success.  Dr. Doylene Canard called. Notified of pts increase in agitation. Discussed this RN's assessment, previous agitation, and report from ED. Unable to take down for stat head CT d/t pt being noncompliant/cooperative. Discussed little known history per family.  Order for Posey to help keep pt in bed. Also to give 2-4mg  morphine, IV, Q2h PRN and attempt head scan.
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
## 2017-01-17 NOTE — Progress Notes (Signed)
Inpatient Rehabilitation  OT has evaluated pt. And is recommending IP Rehab.  Patient was screened by Weldon Picking for appropriateness for an Inpatient Acute Rehab consult.  At this time, we are recommending Inpatient Rehab consult.  Please order consult if you are agreeable.  Weldon Picking PT Inpatient Rehab Admissions Coordinator Cell [phone] Office [phone]
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
## 2017-01-17 NOTE — Progress Notes (Signed)
Orthopedic Tech Progress Note Patient Details:  [name] [birth year] [id number]  Patient ID: [name], female   DOB: [birth year], 51 y.o.   MRN: [id number]   [name] 01/17/2017, 10:32 AM Called in bio-tech brace order; spoke with Wylene Men
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
## 2017-01-18 MED ORDER — CLONAZEPAM 0.5 MG PO TABS
0.5000 mg | ORAL_TABLET | Freq: Two times a day (BID) | ORAL | Status: DC
  Administered 2017-01-18 – 2017-01-20 (×5): 0.5 mg via ORAL
  Filled 2017-01-18 (×5): qty 1

## 2017-01-18 MED ORDER — MORPHINE SULFATE (PF) 2 MG/ML IV SOLN
2.0000 mg | INTRAVENOUS | Status: DC | PRN
  Administered 2017-01-18 (×2): 2 mg via INTRAVENOUS
  Administered 2017-01-19 (×2): 4 mg via INTRAVENOUS
  Filled 2017-01-18: qty 1
  Filled 2017-01-18: qty 2
  Filled 2017-01-18: qty 1
  Filled 2017-01-18: qty 2

## 2017-01-18 MED ORDER — WHITE PETROLATUM GEL
Status: AC
  Administered 2017-01-18: 08:00:00
  Filled 2017-01-18: qty 1
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
## 2017-01-19 DIAGNOSIS — I609 Nontraumatic subarachnoid hemorrhage, unspecified: Secondary | ICD-10-CM

## 2017-01-19 DIAGNOSIS — S069X9A Unspecified intracranial injury with loss of consciousness of unspecified duration, initial encounter: Secondary | ICD-10-CM

## 2017-01-19 DIAGNOSIS — S82832A Other fracture of upper and lower end of left fibula, initial encounter for closed fracture: Secondary | ICD-10-CM

## 2017-01-19 MED ORDER — MELOXICAM 7.5 MG PO TABS
15.0000 mg | ORAL_TABLET | Freq: Every day | ORAL | Status: DC
  Administered 2017-01-19 – 2017-01-20 (×2): 15 mg via ORAL
  Filled 2017-01-19 (×2): qty 2

## 2017-01-19 MED ORDER — HYDROCHLOROTHIAZIDE 25 MG PO TABS
25.0000 mg | ORAL_TABLET | Freq: Every day | ORAL | Status: DC
  Administered 2017-01-19 – 2017-01-20 (×2): 25 mg via ORAL
  Filled 2017-01-19 (×2): qty 1

## 2017-01-19 MED ORDER — AMLODIPINE BESYLATE 10 MG PO TABS
10.0000 mg | ORAL_TABLET | Freq: Every day | ORAL | Status: DC
  Administered 2017-01-19 – 2017-01-20 (×2): 10 mg via ORAL
  Filled 2017-01-19 (×2): qty 1

## 2017-01-19 MED ORDER — CYCLOBENZAPRINE HCL 5 MG PO TABS
5.0000 mg | ORAL_TABLET | Freq: Three times a day (TID) | ORAL | Status: DC | PRN
  Administered 2017-01-19: 5 mg via ORAL
  Filled 2017-01-19: qty 1

## 2017-01-19 MED ORDER — VITAMIN E 45 MG (100 UNIT) PO CAPS
1000.0000 [IU] | ORAL_CAPSULE | Freq: Every day | ORAL | Status: DC
  Administered 2017-01-19 – 2017-01-20 (×2): 1000 [IU] via ORAL
  Filled 2017-01-19 (×2): qty 2

## 2017-01-19 MED ORDER — INTERFERON BETA-1B 0.3 MG ~~LOC~~ KIT: 0.3000 mg | PACK | SUBCUTANEOUS | Status: DC

## 2017-01-19 MED ORDER — DOCUSATE SODIUM 100 MG PO CAPS: 100.0000 mg | ORAL_CAPSULE | Freq: Every day | ORAL | Status: DC | PRN

## 2017-01-19 MED ORDER — SENNA 8.6 MG PO TABS: 1.0000 | ORAL_TABLET | Freq: Every day | ORAL | Status: DC | PRN

## 2017-01-19 MED ORDER — TRAZODONE HCL 150 MG PO TABS
300.0000 mg | ORAL_TABLET | Freq: Every day | ORAL | Status: DC
  Administered 2017-01-19: 300 mg via ORAL
  Filled 2017-01-19: qty 2

## 2017-01-19 MED ORDER — POTASSIUM CHLORIDE CRYS ER 20 MEQ PO TBCR
40.0000 meq | EXTENDED_RELEASE_TABLET | Freq: Every day | ORAL | Status: DC
  Administered 2017-01-19 – 2017-01-20 (×2): 40 meq via ORAL
  Filled 2017-01-19 (×2): qty 2

## 2017-01-19 MED ORDER — GABAPENTIN 300 MG PO CAPS
900.0000 mg | ORAL_CAPSULE | Freq: Four times a day (QID) | ORAL | Status: DC
  Administered 2017-01-19 – 2017-01-20 (×4): 900 mg via ORAL
  Filled 2017-01-19 (×4): qty 3

## 2017-01-19 MED ORDER — TIZANIDINE HCL 2 MG PO TABS: 4.0000 mg | ORAL_TABLET | Freq: Four times a day (QID) | ORAL | Status: DC | PRN

## 2017-01-19 NOTE — Progress Notes (Signed)
Pt transferred to the unit. Pt is alert per baseline. Oriented to room, staff, and call bell as much as possible. Educated to call for any assistance. Bed in lowest position, call bell within reach- will continue to monitor. Avis at bedside
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
## 2017-01-19 NOTE — Progress Notes (Signed)
Pt transported via wheelchair to 6N. Telephone report given to [facility] Water engineer. Pt transported with RN and NT with tele. Stable at discharge. Belongins with black shoes, socks, and purse sent with patient. Other valuables remain in security possession.

## 2017-01-19 NOTE — Progress Notes (Signed)
Patient continues to try to get out of bed without assistance. Patient also will NOT leave tele box on. She is taking it off as soon as it is reapplied [employer], RN

## 2017-01-19 NOTE — Progress Notes (Signed)
Pt constantly trying to get out of bed. Bed alarm on, also tele sitter monitoring pt.. Pt will not leave tele leads on. I have requested 1:1 sitter, but none available.
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
## 2017-01-20 ENCOUNTER — Inpatient Hospital Stay (HOSPITAL_COMMUNITY)
Admission: RE | Admit: 2017-01-20 | Payer: No Typology Code available for payment source | Source: Intra-hospital | Admitting: Physical Medicine & Rehabilitation

## 2017-01-20 ENCOUNTER — Inpatient Hospital Stay (HOSPITAL_COMMUNITY)
Admission: RE | Admit: 2017-01-20 | Discharge: 2017-02-02 | DRG: 945 | Disposition: A | Discharge status: Skilled Nursing Facility | Payer: Medicaid Other | Source: Intra-hospital | Attending: Physical Medicine & Rehabilitation | Admitting: Physical Medicine & Rehabilitation

## 2017-01-20 DIAGNOSIS — S82832D Other fracture of upper and lower end of left fibula, subsequent encounter for closed fracture with routine healing: Secondary | ICD-10-CM

## 2017-01-20 DIAGNOSIS — S82832A Other fracture of upper and lower end of left fibula, initial encounter for closed fracture: Secondary | ICD-10-CM | POA: Diagnosis not present

## 2017-01-20 DIAGNOSIS — R52 Pain, unspecified: Secondary | ICD-10-CM | POA: Diagnosis not present

## 2017-01-20 DIAGNOSIS — G441 Vascular headache, not elsewhere classified: Secondary | ICD-10-CM

## 2017-01-20 DIAGNOSIS — Z91018 Allergy to other foods: Secondary | ICD-10-CM

## 2017-01-20 DIAGNOSIS — M792 Neuralgia and neuritis, unspecified: Secondary | ICD-10-CM

## 2017-01-20 DIAGNOSIS — B964 Proteus (mirabilis) (morganii) as the cause of diseases classified elsewhere: Secondary | ICD-10-CM | POA: Diagnosis not present

## 2017-01-20 DIAGNOSIS — F419 Anxiety disorder, unspecified: Secondary | ICD-10-CM | POA: Diagnosis present

## 2017-01-20 DIAGNOSIS — H548 Legal blindness, as defined in USA: Secondary | ICD-10-CM

## 2017-01-20 DIAGNOSIS — I609 Nontraumatic subarachnoid hemorrhage, unspecified: Secondary | ICD-10-CM

## 2017-01-20 DIAGNOSIS — I10 Essential (primary) hypertension: Secondary | ICD-10-CM | POA: Diagnosis present

## 2017-01-20 DIAGNOSIS — S069X3A Unspecified intracranial injury with loss of consciousness of 1 hour to 5 hours 59 minutes, initial encounter: Secondary | ICD-10-CM | POA: Diagnosis not present

## 2017-01-20 DIAGNOSIS — S069X0S Unspecified intracranial injury without loss of consciousness, sequela: Secondary | ICD-10-CM | POA: Diagnosis not present

## 2017-01-20 DIAGNOSIS — S065X9D Traumatic subdural hemorrhage with loss of consciousness of unspecified duration, subsequent encounter: Secondary | ICD-10-CM | POA: Diagnosis present

## 2017-01-20 DIAGNOSIS — K5900 Constipation, unspecified: Secondary | ICD-10-CM | POA: Diagnosis present

## 2017-01-20 DIAGNOSIS — N179 Acute kidney failure, unspecified: Secondary | ICD-10-CM

## 2017-01-20 DIAGNOSIS — F1410 Cocaine abuse, uncomplicated: Secondary | ICD-10-CM | POA: Diagnosis present

## 2017-01-20 DIAGNOSIS — S069X3S Unspecified intracranial injury with loss of consciousness of 1 hour to 5 hours 59 minutes, sequela: Secondary | ICD-10-CM

## 2017-01-20 DIAGNOSIS — Z79899 Other long term (current) drug therapy: Secondary | ICD-10-CM

## 2017-01-20 DIAGNOSIS — Z888 Allergy status to other drugs, medicaments and biological substances status: Secondary | ICD-10-CM | POA: Diagnosis not present

## 2017-01-20 DIAGNOSIS — N390 Urinary tract infection, site not specified: Secondary | ICD-10-CM | POA: Diagnosis not present

## 2017-01-20 DIAGNOSIS — K5901 Slow transit constipation: Secondary | ICD-10-CM

## 2017-01-20 DIAGNOSIS — G35 Multiple sclerosis: Secondary | ICD-10-CM | POA: Diagnosis present

## 2017-01-20 DIAGNOSIS — S066X9A Traumatic subarachnoid hemorrhage with loss of consciousness of unspecified duration, initial encounter: Principal | ICD-10-CM

## 2017-01-20 DIAGNOSIS — G8918 Other acute postprocedural pain: Secondary | ICD-10-CM

## 2017-01-20 DIAGNOSIS — R4189 Other symptoms and signs involving cognitive functions and awareness: Secondary | ICD-10-CM | POA: Diagnosis present

## 2017-01-20 DIAGNOSIS — F411 Generalized anxiety disorder: Secondary | ICD-10-CM

## 2017-01-20 DIAGNOSIS — S069X2S Unspecified intracranial injury with loss of consciousness of 31 minutes to 59 minutes, sequela: Secondary | ICD-10-CM | POA: Diagnosis not present

## 2017-01-20 LAB — BASIC METABOLIC PANEL
ANION GAP: 12 (ref 5–15)
BUN: 10 mg/dL (ref 6–20)
CHLORIDE: 103 mmol/L (ref 101–111)
CO2: 27 mmol/L (ref 22–32)
Calcium: 9.2 mg/dL (ref 8.9–10.3)
Creatinine, Ser: 1.37 mg/dL — ABNORMAL HIGH (ref 0.44–1.00)
GFR, EST AFRICAN AMERICAN: 50 mL/min — AB (ref >60)
GFR, EST NON AFRICAN AMERICAN: 43 mL/min — AB (ref >60)
Glucose, Bld: 143 mg/dL — ABNORMAL HIGH (ref 65–99)
POTASSIUM: 3.5 mmol/L (ref 3.5–5.1)
SODIUM: 142 mmol/L (ref 135–145)

## 2017-01-20 MED ORDER — BISACODYL 10 MG RE SUPP
10.0000 mg | Freq: Every day | RECTAL | Status: DC | PRN
  Administered 2017-01-25: 10 mg via RECTAL
  Filled 2017-01-20: qty 1

## 2017-01-20 MED ORDER — OXYCODONE HCL 5 MG PO TABS
5.0000 mg | ORAL_TABLET | ORAL | Status: DC | PRN
  Administered 2017-01-21 – 2017-01-27 (×13): 5 mg via ORAL
  Filled 2017-01-20 (×13): qty 1

## 2017-01-20 MED ORDER — GABAPENTIN 300 MG PO CAPS
900.0000 mg | ORAL_CAPSULE | Freq: Four times a day (QID) | ORAL | Status: DC
  Administered 2017-01-20 – 2017-01-21 (×4): 900 mg via ORAL
  Filled 2017-01-20 (×4): qty 3

## 2017-01-20 MED ORDER — AMLODIPINE BESYLATE 10 MG PO TABS
10.0000 mg | ORAL_TABLET | Freq: Every day | ORAL | Status: DC
  Administered 2017-01-21 – 2017-02-02 (×13): 10 mg via ORAL
  Filled 2017-01-20 (×13): qty 1

## 2017-01-20 MED ORDER — VITAMIN E 180 MG (400 UNIT) PO CAPS
1000.0000 [IU] | ORAL_CAPSULE | Freq: Every day | ORAL | Status: DC
  Administered 2017-01-21 – 2017-02-02 (×13): 1000 [IU] via ORAL
  Filled 2017-01-20 (×13): qty 2

## 2017-01-20 MED ORDER — ONDANSETRON HCL 4 MG PO TABS: 4.0000 mg | ORAL_TABLET | Freq: Four times a day (QID) | ORAL | Status: DC | PRN

## 2017-01-20 MED ORDER — WHITE PETROLATUM GEL
Status: AC
  Filled 2017-01-20: qty 1

## 2017-01-20 MED ORDER — DOCUSATE SODIUM 100 MG PO CAPS
100.0000 mg | ORAL_CAPSULE | Freq: Two times a day (BID) | ORAL | Status: DC
  Administered 2017-01-20 – 2017-02-02 (×25): 100 mg via ORAL
  Filled 2017-01-20 (×27): qty 1

## 2017-01-20 MED ORDER — POTASSIUM CHLORIDE CRYS ER 20 MEQ PO TBCR
40.0000 meq | EXTENDED_RELEASE_TABLET | Freq: Every day | ORAL | Status: DC
  Administered 2017-01-21 – 2017-02-02 (×13): 40 meq via ORAL
  Filled 2017-01-20 (×13): qty 2

## 2017-01-20 MED ORDER — ACETAMINOPHEN 325 MG PO TABS
650.0000 mg | ORAL_TABLET | ORAL | Status: DC | PRN
  Administered 2017-01-21 – 2017-02-01 (×4): 650 mg via ORAL
  Filled 2017-01-20 (×5): qty 2

## 2017-01-20 MED ORDER — BOOST / RESOURCE BREEZE PO LIQD
1.0000 | Freq: Three times a day (TID) | ORAL | Status: DC
  Administered 2017-01-20 – 2017-02-02 (×30): 1 via ORAL

## 2017-01-20 MED ORDER — INTERFERON BETA-1B 0.3 MG ~~LOC~~ KIT
0.3000 mg | PACK | SUBCUTANEOUS | Status: DC
  Administered 2017-01-21 – 2017-02-02 (×7): 0.3 mg via SUBCUTANEOUS
  Filled 2017-01-20 (×11): qty 0.3

## 2017-01-20 MED ORDER — CLONAZEPAM 0.5 MG PO TABS
0.5000 mg | ORAL_TABLET | Freq: Two times a day (BID) | ORAL | Status: DC
  Administered 2017-01-20 – 2017-02-02 (×26): 0.5 mg via ORAL
  Filled 2017-01-20 (×26): qty 1

## 2017-01-20 MED ORDER — ONDANSETRON HCL 4 MG/2ML IJ SOLN: 4.0000 mg | Freq: Four times a day (QID) | INTRAMUSCULAR | Status: DC | PRN

## 2017-01-20 MED ORDER — PANTOPRAZOLE SODIUM 40 MG PO TBEC
40.0000 mg | DELAYED_RELEASE_TABLET | Freq: Every day | ORAL | Status: DC
  Administered 2017-01-21 – 2017-02-02 (×13): 40 mg via ORAL
  Filled 2017-01-20 (×13): qty 1

## 2017-01-20 MED ORDER — CYCLOBENZAPRINE HCL 5 MG PO TABS
5.0000 mg | ORAL_TABLET | Freq: Three times a day (TID) | ORAL | Status: DC | PRN
  Administered 2017-01-25: 5 mg via ORAL
  Filled 2017-01-20: qty 1

## 2017-01-20 MED ORDER — HYDROCHLOROTHIAZIDE 25 MG PO TABS
25.0000 mg | ORAL_TABLET | Freq: Every day | ORAL | Status: DC
  Administered 2017-01-21 – 2017-02-02 (×13): 25 mg via ORAL
  Filled 2017-01-20 (×13): qty 1

## 2017-01-20 MED ORDER — SORBITOL 70 % SOLN
30.0000 mL | Freq: Every day | Status: DC | PRN
  Administered 2017-01-22 – 2017-01-24 (×2): 30 mL via ORAL
  Filled 2017-01-20 (×2): qty 30

## 2017-01-20 MED ORDER — TRAZODONE HCL 50 MG PO TABS
300.0000 mg | ORAL_TABLET | Freq: Every day | ORAL | Status: DC
  Administered 2017-01-20 – 2017-02-01 (×13): 300 mg via ORAL
  Filled 2017-01-20 (×13): qty 6

## 2017-01-20 MED ORDER — TIZANIDINE HCL 4 MG PO TABS
4.0000 mg | ORAL_TABLET | Freq: Four times a day (QID) | ORAL | Status: DC | PRN
  Filled 2017-01-20: qty 1
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
## 2017-01-20 NOTE — Progress Notes (Signed)
Pt's sister Oneta Rack was notified of discharge to [facility] room 26.

## 2017-01-20 NOTE — Progress Notes (Signed)
Discharged pt to Inpt rehab (4 west) Rm 26 via wheelchair. Pt is calm and cooperative upon discharge.

## 2017-01-20 NOTE — Progress Notes (Signed)
Rehab admissions - I met with patient and her boyfriend, [name], at the bedside.  They are both in agreement to inpatient rehab prior to discharge home.  [name] confirms to me that patient will have supervision after discharge and will not be left alone.  Patient has been cleared by trauma service for inpatient rehab admission today.  Bed available and will admit to acute inpatient rehab today.  Call me for questions.  #[phone]
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
## 2017-01-21 ENCOUNTER — Inpatient Hospital Stay (HOSPITAL_COMMUNITY): Payer: Medicaid Other | Admitting: Physical Therapy

## 2017-01-21 ENCOUNTER — Inpatient Hospital Stay (HOSPITAL_COMMUNITY): Payer: Medicaid Other | Admitting: Occupational Therapy

## 2017-01-21 ENCOUNTER — Inpatient Hospital Stay (HOSPITAL_COMMUNITY): Payer: Medicaid Other | Admitting: Speech Pathology

## 2017-01-21 DIAGNOSIS — M792 Neuralgia and neuritis, unspecified: Secondary | ICD-10-CM

## 2017-01-21 DIAGNOSIS — H548 Legal blindness, as defined in USA: Secondary | ICD-10-CM

## 2017-01-21 DIAGNOSIS — G35 Multiple sclerosis: Secondary | ICD-10-CM

## 2017-01-21 DIAGNOSIS — R52 Pain, unspecified: Secondary | ICD-10-CM

## 2017-01-21 DIAGNOSIS — N179 Acute kidney failure, unspecified: Secondary | ICD-10-CM

## 2017-01-21 DIAGNOSIS — S069X0S Unspecified intracranial injury without loss of consciousness, sequela: Secondary | ICD-10-CM

## 2017-01-21 DIAGNOSIS — F411 Generalized anxiety disorder: Secondary | ICD-10-CM

## 2017-01-21 DIAGNOSIS — I609 Nontraumatic subarachnoid hemorrhage, unspecified: Secondary | ICD-10-CM

## 2017-01-21 MED ORDER — GABAPENTIN 300 MG PO CAPS
900.0000 mg | ORAL_CAPSULE | Freq: Three times a day (TID) | ORAL | Status: DC
  Administered 2017-01-22 – 2017-02-02 (×34): 900 mg via ORAL
  Filled 2017-01-21 (×34): qty 3
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
## 2017-01-21 NOTE — Progress Notes (Signed)
[facility] PHYSICAL MEDICINE & REHABILITATION     PROGRESS NOTE  Subjective/Complaints:  Pt seen laying in bed this AM.  She slept well overnight.  She is still sleepy, but ready for therapies.   ROS: Denies CP, SOB, N/V/D.  Objective: Vital Signs: Blood pressure (!) 136/92, pulse 92, temperature 99.3 F (37.4 C), temperature source Oral, resp. rate 17, height 5' (1.524 m), weight 68.8 kg (151 lb 9.6 oz), SpO2 99 %. No results found. No results for input(s): WBC, HGB, HCT, PLT in the last 72 hours.  Recent Labs  01/20/17 0526  NA 142  K 3.5  CL 103  GLUCOSE 143*  BUN 10  CREATININE 1.37*  CALCIUM 9.2   CBG (last 3)  No results for input(s): GLUCAP in the last 72 hours.  Wt Readings from Last 3 Encounters:  01/20/17 68.8 kg (151 lb 9.6 oz)  01/16/17 63.8 kg (140 lb 10.5 oz)    Physical Exam:  BP (!) 136/92 (BP Location: Left Arm)   Pulse 92   Temp 99.3 F (37.4 C) (Oral)   Resp 17   Ht 5' (1.524 m)   Wt 68.8 kg (151 lb 9.6 oz)   LMP  (LMP Unknown)   SpO2 99%   BMI 29.61 kg/m  Constitutional: She appears well-developed and well-nourished.  HENT: Right facial edema with healing incisions  Eyes: EOM are normal. No discharge. She can see some shadows. Injected right sclera  Cardiovascular: Normal rate and regular rhythm.  No JVD. Respiratory: Effort normal and breath sounds normal.  GI: Soft. Bowel sounds are normal.  Musculoskeletal: No edema. Tenderness RLE Neurological: A&Ox2 Sensation intact to light touch Motor: 5/5 bilateral deltoid, bicep, tricep, grip, right hip flexion, knee extension, ankle dorsiflexion, plantarflexion. RLE: HF, KE 4-/5, 5/5 ADF/PF LLE: HF, NE 2+/5, ADF/PF 4-/5  Skin: Skin is warm and dry.   Assessment/Plan: 1. Functional deficits secondary to cognitive deficits to TBI/traumatic SDH, left fibular fracture which require 3+ hours per day of interdisciplinary therapy in a comprehensive inpatient rehab setting. Physiatrist is providing  close team supervision and 24 hour management of active medical problems listed below. Physiatrist and rehab team continue to assess barriers to discharge/monitor patient progress toward functional and medical goals.  Function:  Bathing Bathing position   Position: Wheelchair/chair at sink  Bathing parts Body parts bathed by patient: Right arm, Left arm, Chest, Abdomen, Front perineal area, Buttocks, Right upper leg Body parts bathed by helper: Right lower leg, Back  Bathing assist Assist Level: Touching or steadying assistance(Pt > 75%)      Upper Body Dressing/Undressing Upper body dressing   What is the patient wearing?: Pull over shirt/dress     Pull over shirt/dress - Perfomed by patient: Thread/unthread right sleeve, Thread/unthread left sleeve, Pull shirt over trunk Pull over shirt/dress - Perfomed by helper: Put head through opening        Upper body assist Assist Level: Touching or steadying assistance(Pt > 75%)      Lower Body Dressing/Undressing Lower body dressing   What is the patient wearing?: Pants, Non-skid slipper socks, Underwear Underwear - Performed by patient: Thread/unthread right underwear leg, Pull underwear up/down Underwear - Performed by helper: Thread/unthread left underwear leg Pants- Performed by patient: Thread/unthread right pants leg, Pull pants up/down Pants- Performed by helper: Thread/unthread left pants leg Non-skid slipper socks- Performed by patient: Don/doff right sock Non-skid slipper socks- Performed by helper: Don/doff left sock  Lower body assist Assist for lower body dressing: Touching or steadying assistance (Pt > 75%)      Toileting Toileting Toileting activity did not occur: No continent bowel/bladder event Toileting steps completed by patient: Adjust clothing prior to toileting, Performs perineal hygiene, Adjust clothing after toileting   Toileting Assistive Devices: Grab bar or rail  Toileting assist  Assist level: Touching or steadying assistance (Pt.75%)   Transfers Chair/bed transfer   Chair/bed transfer method: Stand pivot, Ambulatory Chair/bed transfer assist level: Touching or steadying assistance (Pt > 75%)       Locomotion Ambulation     Max distance: 100 Assist level: Touching or steadying assistance (Pt > 75%)   Wheelchair   Type: Manual Max wheelchair distance: 150 Assist Level: Dependent (Pt equals 0%)  Cognition Comprehension Comprehension assist level: Understands basic 90% of the time/cues < 10% of the time  Expression Expression assist level: Expresses basic 90% of the time/requires cueing < 10% of the time.  Social Interaction Social Interaction assist level: Interacts appropriately 25 - 49% of time - Needs frequent redirection.  Problem Solving Problem solving assist level: Solves basic 50 - 74% of the time/requires cueing 25 - 49% of the time  Memory Memory assist level: Recognizes or recalls 50 - 74% of the time/requires cueing 25 - 49% of the time    Medical Problem List and Plan: 1.  Decreased functional mobility secondary to cognitive deficits to TBI/traumatic SDH, left fibular fracture-weightbearing as tolerated with Bledsoe brace  Begin CIR 2.  DVT Prophylaxis/Anticoagulation: SCDs. Monitor for any signs of DVT 3. Pain Management: Neurontin decreased to 900 mg TID, Mobic 15 mg daily, Trazodone 300 mg daily at bedtime Oxycodone as needed, Flexeril and Zanaflex as needed 4. Mood: Klonopin 0.5 mg twice a day 5. Neuropsych: This patient is capable of making decisions on her own behalf. 6. Skin/Wound Care: Routine skin checks 7. Fluids/Electrolytes/Nutrition: Routine I&Os 8. Legally blind. Patient can see shadows only 9. Hypertension. Norvasc 10 mg daily, HCTZ 25 mg daily  Monitor with increased mobility 10. History of multiple sclerosis. Continue Betaseron as directed 11. Constipation. Laxative assistance 12. AKI  Cr. 1.37 on 3/2  Encourage  fluids  LOS (Days) 1 A FACE TO FACE EVALUATION WAS PERFORMED  Alasia Enge Karis Juba 01/21/2017 10:08 PM
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
## 2017-01-22 DIAGNOSIS — G441 Vascular headache, not elsewhere classified: Secondary | ICD-10-CM

## 2017-01-22 MED ORDER — TOPIRAMATE 25 MG PO TABS
25.0000 mg | ORAL_TABLET | Freq: Every day | ORAL | Status: DC
  Administered 2017-01-22 – 2017-01-27 (×6): 25 mg via ORAL
  Filled 2017-01-22 (×6): qty 1

## 2017-01-22 NOTE — Progress Notes (Signed)
Patient got out of bed to "go wash my face." Reminded patient that she needs to call for assistance. She stated she did not know she could not get up alone. Expressed frustration with this expectation, but verbalized agreement. Left patient washing up with NT at the sink.

## 2017-01-22 NOTE — Progress Notes (Signed)
[place] PHYSICAL MEDICINE & REHABILITATION     PROGRESS NOTE  Subjective/Complaints:  Pt seen laying in bed this AM.  She slept well overnight and had a good first day in therapies.  Per nursing, pt with headache.  ROS: Denies CP, SOB, N/V/D.  Objective: Vital Signs: Blood pressure 136/83, pulse 99, temperature 98.3 F (36.8 C), temperature source Oral, resp. rate 16, height 5' (1.524 m), weight 68.8 kg (151 lb 9.6 oz), SpO2 99 %. No results found. No results for input(s): WBC, HGB, HCT, PLT in the last 72 hours.  Recent Labs  01/20/17 0526  NA 142  K 3.5  CL 103  GLUCOSE 143*  BUN 10  CREATININE 1.37*  CALCIUM 9.2   CBG (last 3)  No results for input(s): GLUCAP in the last 72 hours.  Wt Readings from Last 3 Encounters:  01/20/17 68.8 kg (151 lb 9.6 oz)  01/16/17 63.8 kg (140 lb 10.5 oz)    Physical Exam:  BP 136/83   Pulse 99   Temp 98.3 F (36.8 C) (Oral)   Resp 16   Ht 5' (1.524 m)   Wt 68.8 kg (151 lb 9.6 oz)   LMP  (LMP Unknown)   SpO2 99%   BMI 29.61 kg/m  Constitutional: She appears well-developed and well-nourished.  HENT: Right facial edema with healing incisions Eyes: EOM are normal. No discharge. She can see some shadows. Injected right sclera  Cardiovascular: Normal rate and regular rhythm.  No JVD. Respiratory: Effort normal and breath sounds normal.  GI: Soft. Bowel sounds are normal.  Musculoskeletal: No edema. Tenderness RLE Neurological: A&Ox2 Sensation intact to light touch Motor: 5/5 bilateral deltoid, bicep, tricep, grip, right hip flexion, knee extension, ankle dorsiflexion, plantarflexion. RLE: HF, KE 4-/5, 5/5 ADF/PF LLE: HF, NE 3/5, ADF/PF 3+/5  Skin: Skin is warm and dry.   Assessment/Plan: 1. Functional deficits secondary to cognitive deficits to TBI/traumatic SDH, left fibular fracture which require 3+ hours per day of interdisciplinary therapy in a comprehensive inpatient rehab setting. Physiatrist is providing close team  supervision and 24 hour management of active medical problems listed below. Physiatrist and rehab team continue to assess barriers to discharge/monitor patient progress toward functional and medical goals.  Function:  Bathing Bathing position   Position: Wheelchair/chair at sink  Bathing parts Body parts bathed by patient: Right arm, Left arm, Chest, Abdomen, Front perineal area, Buttocks, Right upper leg Body parts bathed by helper: Right lower leg, Back  Bathing assist Assist Level: Touching or steadying assistance(Pt > 75%)      Upper Body Dressing/Undressing Upper body dressing   What is the patient wearing?: Pull over shirt/dress     Pull over shirt/dress - Perfomed by patient: Thread/unthread right sleeve, Thread/unthread left sleeve, Pull shirt over trunk Pull over shirt/dress - Perfomed by helper: Put head through opening        Upper body assist Assist Level: Touching or steadying assistance(Pt > 75%)      Lower Body Dressing/Undressing Lower body dressing   What is the patient wearing?: Pants, Non-skid slipper socks, Underwear Underwear - Performed by patient: Thread/unthread right underwear leg, Pull underwear up/down Underwear - Performed by helper: Thread/unthread left underwear leg Pants- Performed by patient: Thread/unthread right pants leg, Pull pants up/down Pants- Performed by helper: Thread/unthread left pants leg Non-skid slipper socks- Performed by patient: Don/doff right sock Non-skid slipper socks- Performed by helper: Don/doff left sock  Lower body assist Assist for lower body dressing: Touching or steadying assistance (Pt > 75%)      Toileting Toileting Toileting activity did not occur: No continent bowel/bladder event Toileting steps completed by patient: Adjust clothing prior to toileting, Performs perineal hygiene, Adjust clothing after toileting   Toileting Assistive Devices: Grab bar or rail  Toileting assist Assist  level: Touching or steadying assistance (Pt.75%)   Transfers Chair/bed transfer   Chair/bed transfer method: Stand pivot, Ambulatory Chair/bed transfer assist level: Touching or steadying assistance (Pt > 75%)       Locomotion Ambulation     Max distance: 100 Assist level: Touching or steadying assistance (Pt > 75%)   Wheelchair   Type: Manual Max wheelchair distance: 150 Assist Level: Dependent (Pt equals 0%)  Cognition Comprehension Comprehension assist level: Understands basic 90% of the time/cues < 10% of the time  Expression Expression assist level: Expresses basic 90% of the time/requires cueing < 10% of the time.  Social Interaction Social Interaction assist level: Interacts appropriately 25 - 49% of time - Needs frequent redirection.  Problem Solving Problem solving assist level: Solves basic 50 - 74% of the time/requires cueing 25 - 49% of the time  Memory Memory assist level: Recognizes or recalls 50 - 74% of the time/requires cueing 25 - 49% of the time    Medical Problem List and Plan: 1.  Decreased functional mobility secondary to cognitive deficits to TBI/traumatic SDH, left fibular fracture-weightbearing as tolerated with Bledsoe brace  Cont CIR 2.  DVT Prophylaxis/Anticoagulation: SCDs. Monitor for any signs of DVT 3. Pain Management: Neurontin decreased to 900 mg TID, Mobic 15 mg daily, Trazodone 300 mg daily at bedtime Oxycodone as needed, Flexeril and Zanaflex as needed  Topamax 25 daily started 3/4 for headaches 4. Mood: Klonopin 0.5 mg twice a day 5. Neuropsych: This patient is capable of making decisions on her own behalf. 6. Skin/Wound Care: Routine skin checks 7. Fluids/Electrolytes/Nutrition: Routine I&Os 8. Legally blind. Patient can see shadows only 9. Hypertension. Norvasc 10 mg daily, HCTZ 25 mg daily  Controlled 3/4 10. History of multiple sclerosis. Continue Betaseron as directed 11. Constipation. Laxative assistance 12. AKI  Cr. 1.37 on  3/2  Encourage fluids  LOS (Days) 2 A FACE TO FACE EVALUATION WAS PERFORMED  Raseel Jans Karis Juba 01/22/2017 8:41 AM

## 2017-01-23 ENCOUNTER — Inpatient Hospital Stay (HOSPITAL_COMMUNITY): Payer: Medicaid Other | Admitting: Physical Therapy

## 2017-01-23 ENCOUNTER — Inpatient Hospital Stay (HOSPITAL_COMMUNITY): Payer: Medicaid Other

## 2017-01-23 ENCOUNTER — Inpatient Hospital Stay (HOSPITAL_COMMUNITY): Payer: Medicaid Other | Admitting: Speech Pathology

## 2017-01-23 DIAGNOSIS — S069X2S Unspecified intracranial injury with loss of consciousness of 31 minutes to 59 minutes, sequela: Secondary | ICD-10-CM

## 2017-01-23 LAB — CBC WITH DIFFERENTIAL/PLATELET
Basophils Absolute: 0.1 10*3/uL (ref 0.0–0.1)
Basophils Relative: 1 %
Eosinophils Absolute: 0.1 10*3/uL (ref 0.0–0.7)
Eosinophils Relative: 1 %
HCT: 33.5 % — ABNORMAL LOW (ref 36.0–46.0)
Hemoglobin: 11.2 g/dL — ABNORMAL LOW (ref 12.0–15.0)
Lymphocytes Relative: 37 %
Lymphs Abs: 2.1 10*3/uL (ref 0.7–4.0)
MCH: 29.9 pg (ref 26.0–34.0)
MCHC: 33.4 g/dL (ref 30.0–36.0)
MCV: 89.3 fL (ref 78.0–100.0)
Monocytes Absolute: 0.5 10*3/uL (ref 0.1–1.0)
Monocytes Relative: 9 %
Neutro Abs: 2.9 10*3/uL (ref 1.7–7.7)
Neutrophils Relative %: 52 %
Platelets: 226 10*3/uL (ref 150–400)
RBC: 3.75 MIL/uL — ABNORMAL LOW (ref 3.87–5.11)
RDW: 14.1 % (ref 11.5–15.5)
WBC: 5.7 10*3/uL (ref 4.0–10.5)

## 2017-01-23 LAB — COMPREHENSIVE METABOLIC PANEL
ALT: 26 U/L (ref 14–54)
AST: 39 U/L (ref 15–41)
Albumin: 3.1 g/dL — ABNORMAL LOW (ref 3.5–5.0)
Alkaline Phosphatase: 44 U/L (ref 38–126)
Anion gap: 6 (ref 5–15)
BUN: 14 mg/dL (ref 6–20)
CO2: 29 mmol/L (ref 22–32)
Calcium: 9.3 mg/dL (ref 8.9–10.3)
Chloride: 103 mmol/L (ref 101–111)
Creatinine, Ser: 0.74 mg/dL (ref 0.44–1.00)
GFR calc Af Amer: >60 mL/min (ref >60)
GFR calc non Af Amer: >60 mL/min (ref >60)
Glucose, Bld: 106 mg/dL — ABNORMAL HIGH (ref 65–99)
Potassium: 3.6 mmol/L (ref 3.5–5.1)
Sodium: 138 mmol/L (ref 135–145)
Total Bilirubin: 0.6 mg/dL (ref 0.3–1.2)
Total Protein: 6.8 g/dL (ref 6.5–8.1)

## 2017-01-23 NOTE — IPOC Note (Signed)
Overall Plan of Care [facility]) Patient Details Name: JULLIAN PREVITI MRN: [id number] DOB: 05-12-1966  Admitting Diagnosis: TBI L FIB FX  Hospital Problems: Active Problems:   SAH (subarachnoid hemorrhage) (HCC)   Traumatic brain injury (HCC)   Pain   Neuropathic pain   MS (multiple sclerosis) (HCC)   AKI (acute kidney injury) (HCC)   Vascular headache     Functional Problem List: Nursing Pain, Endurance, Medication Management, Skin Integrity, Safety  PT Balance, Safety, Endurance, Motor, Pain, Perception, Behavior  OT Balance, Cognition, Endurance, Motor, Pain, Safety, Vision (vision impairment baseline)  SLP Cognition  TR         Basic ADL's: OT Eating, Grooming, Bathing, Dressing, Toileting     Advanced  ADL's: OT Simple Meal Preparation     Transfers: PT Bed Mobility, Bed to Chair, Car, Occupational psychologist, Research scientist (life sciences): PT Stairs, Psychologist, prison and probation services, Ambulation     Additional Impairments: OT None  SLP Social Cognition   Problem Solving, Memory, Attention, Awareness  TR      Anticipated Outcomes Item Anticipated Outcome  Self Feeding Mod I  Swallowing      Basic self-care  Mod I  Toileting  Mod I   Bathroom Transfers Supervision  Bowel/Bladder  n/a  Transfers  supervision  Locomotion  supervision  Communication     Cognition  Supervision with basic   Pain  Manage pain at or below level 5 with prn medications  Safety/Judgment  Maintain safety with supervision/cues   Therapy Plan: PT Intensity: Minimum of 1-2 x/day ,45 to 90 minutes PT Frequency: 5 out of 7 days PT Duration Estimated Length of Stay: 14-18 days OT Intensity: Minimum of 1-2 x/day, 45 to 90 minutes OT Frequency: 5 out of 7 days OT Duration/Estimated Length of Stay: 14-18 days SLP Intensity: Minumum of 1-2 x/day, 30 to 90 minutes SLP Frequency: 3 to 5 out of 7 days SLP Duration/Estimated Length of Stay: 10-14 days        Team Interventions: Nursing Interventions  Patient/Family Education, Skin Care/Wound Management, Discharge Planning, Pain Management, Cognitive Remediation/Compensation, Psychosocial Support, Medication Management  PT interventions Ambulation/gait training, Community reintegration, DME/adaptive equipment instruction, Neuromuscular re-education, Psychosocial support, Stair training, UE/LE Strength taining/ROM, Wheelchair propulsion/positioning, UE/LE Coordination activities, Therapeutic Activities, Warden/ranger, Discharge planning, Cognitive remediation/compensation, Disease management/prevention, Functional mobility training, Patient/family education, Splinting/orthotics, Therapeutic Exercise  OT Interventions Warden/ranger, Cognitive remediation/compensation, Community reintegration, Discharge planning, DME/adaptive equipment instruction, Functional mobility training, Pain management, Patient/family education, Self Care/advanced ADL retraining, Psychosocial support, Therapeutic Activities, Therapeutic Exercise, UE/LE Coordination activities, UE/LE Strength taining/ROM, Visual/perceptual remediation/compensation  SLP Interventions Cognitive remediation/compensation, Cueing hierarchy, Environmental controls, Functional tasks, Internal/external aids, Patient/family education, Therapeutic Activities  TR Interventions    SW/CM Interventions Discharge Planning, Psychosocial Support, Patient/Family Education    Team Discharge Planning: Destination: PT-Home ,OT- Home , SLP-Home Projected Follow-up: PT-Home health PT, 24 hour supervision/assistance, OT-  Home health OT, SLP-24 hour supervision/assistance, Outpatient SLP Projected Equipment Needs: PT-To be determined, OT- To be determined, SLP-None recommended by SLP Equipment Details: PT- , OT-  Patient/family involved in discharge planning: PT- Patient,  OT-Patient, SLP-Patient  MD ELOS: 14 days Medical Rehab Prognosis:  Excellent Assessment: The patient has been  admitted for CIR therapies with the diagnosis of TBI, left fibular fx. The team will be addressing functional mobility, strength, stamina, balance, safety, adaptive techniques and equipment, self-care, bowel and bladder mgt, patient and caregiver education, NMR, cognitive perceptual rx, pain control, ortho precautions, orthotics, behavioral  mgt, ego support. Goals have been set at mod I to supervision with mobiltiy and self-care and supervision with cognition.    Ranelle Oyster, MD, FAAPMR      See Team Conference Notes for weekly updates to the plan of care
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
## 2017-01-23 NOTE — Progress Notes (Signed)
Occupational Therapy Session Note  Patient Details  Name: [name] MRN: [id number] Date of Birth: 11/19/1966  Today's Date: 01/23/2017 OT Individual Time: 1300-1356 OT Individual Time Calculation (min): 56 min    Short Term Goals: Week 1:  OT Short Term Goal 1 (Week 1): Pt will don pants using reacher and Min A OT Short Term Goal 2 (Week 1): Pt will tolerate 5 minutes standing at the sink in preparation for ADL tasks OT Short Term Goal 3 (Week 1): Pt will sequence 3 step ADL with min verbal cues  Skilled Therapeutic Interventions/Progress Updates:    Pt resting in bed upon arrival.  Pt required tot A for orientation to place and situation.  Pt inquired if she was in a loft and how many steps to get into her loft.  Pt accepted explanation and continued with therapy.  Pt had not eaten lunch, stating that she didn't want the hamburger.  Pt agreeable to yogurt and completed eating with setup to open container.  Pt required min A for bed mobility and tot A for donning LLE Bledsoe.  Pt does not have any clothing and declined bathing this afternoon but agreeable to taking a shower tomorrow (PA approved). Pt performed sit<>stand X 5 and c/o increased pain when standing which was relieved when returned to EOB.  Pt's conversation was tangential throughout session requiring mod verbal cues to redirect to conversation.  Pt returned to bed with all needs within reach and bed alarm activated.  Telesitter activated.    Therapy Documentation Precautions:  Precautions Precautions: Fall Precaution Comments: Pt is visually impaired at baseline Required Braces or Orthoses: Other Brace/Splint Other Brace/Splint: Hinged knee brace- unrestricted AROM at L knee Restrictions Weight Bearing Restrictions: Yes LLE Weight Bearing: Weight bearing as tolerated Other Position/Activity Restrictions: Hinged Knee Brace Pain:  Pt denied pain  See Function Navigator for Current Functional Status.   Therapy/Group:  Individual Therapy  Rich Brave 01/23/2017, 1:58 PM
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
## 2017-01-23 NOTE — Progress Notes (Signed)
Speech Language Pathology Daily Session Note  Patient Details  Name: [name] MRN: [id number] Date of Birth: 1966-03-19  Today's Date: 01/23/2017 SLP Individual Time: 0830-0930 SLP Individual Time Calculation (min): 60 min  Short Term Goals: Week 1: SLP Short Term Goal 1 (Week 1): Patient will sustain attention to functional tasks for 15-20 minutes Min assist cues for redirection  SLP Short Term Goal 2 (Week 1): Patient will solve basic problems related to self-care with Min assist verbal cues  SLP Short Term Goal 3 (Week 1): Patient will identify 2 physical and 2 cognitive deficits post injury SLP Short Term Goal 4 (Week 1): Patient will utilize external aids and/or memory strategies to recall daily and new information with Mod assist verbal cues   Skilled Therapeutic Interventions: Skilled treatment session focused on cognitive goals. SLP facilitated session by providing supervision verbal cues for recall of her current medications and their functions and total A for reassurance that she is receiving medication appropraitely (reported she did not get her sleeping pill last night). Patient required Max A for intellectual of physical and cognitive deficits and their impact on her overall function. Patient also required Max A verbal cues for recall of information after a 5 minute delay. Patient left upright in bed with all needs within reach and alarm on. Continue with current plan of care.      Function:    Cognition Comprehension Comprehension assist level: Understands basic 50 - 74% of the time/ requires cueing 25 - 49% of the time  Expression   Expression assist level: Expresses basic 50 - 74% of the time/requires cueing 25 - 49% of the time. Needs to repeat parts of sentences.  Social Interaction Social Interaction assist level: Interacts appropriately 25 - 49% of time - Needs frequent redirection.  Problem Solving Problem solving assist level: Solves basic 50 - 74% of the  time/requires cueing 25 - 49% of the time  Memory Memory assist level: Recognizes or recalls less than 25% of the time/requires cueing greater than 75% of the time    Pain No/Denies Pain   Therapy/Group: Individual Therapy  [name] 01/23/2017, 9:32 AM
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
## 2017-01-24 ENCOUNTER — Inpatient Hospital Stay (HOSPITAL_COMMUNITY): Payer: Medicaid Other | Admitting: Occupational Therapy

## 2017-01-24 ENCOUNTER — Inpatient Hospital Stay (HOSPITAL_COMMUNITY): Payer: Medicaid Other

## 2017-01-24 ENCOUNTER — Inpatient Hospital Stay (HOSPITAL_COMMUNITY): Payer: Medicaid Other | Admitting: Physical Therapy

## 2017-01-24 ENCOUNTER — Inpatient Hospital Stay (HOSPITAL_COMMUNITY): Payer: Medicaid Other | Admitting: Speech Pathology

## 2017-01-24 DIAGNOSIS — S069X3S Unspecified intracranial injury with loss of consciousness of 1 hour to 5 hours 59 minutes, sequela: Secondary | ICD-10-CM

## 2017-01-24 MED ORDER — NICOTINE 7 MG/24HR TD PT24
7.0000 mg | MEDICATED_PATCH | Freq: Every day | TRANSDERMAL | Status: DC
  Administered 2017-01-24 – 2017-02-02 (×10): 7 mg via TRANSDERMAL
  Filled 2017-01-24 (×10): qty 1
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
## 2017-01-24 NOTE — Progress Notes (Signed)
Speech Language Pathology Daily Session Note  Patient Details  Name: [name] MRN: [id number] Date of Birth: Feb 27, 1966  Today's Date: 01/24/2017 SLP Individual Time: 4098-1191 SLP Individual Time Calculation (min): 55 min  Short Term Goals: Week 1: SLP Short Term Goal 1 (Week 1): Patient will sustain attention to functional tasks for 15-20 minutes Min assist cues for redirection  SLP Short Term Goal 2 (Week 1): Patient will solve basic problems related to self-care with Min assist verbal cues  SLP Short Term Goal 3 (Week 1): Patient will identify 2 physical and 2 cognitive deficits post injury SLP Short Term Goal 4 (Week 1): Patient will utilize external aids and/or memory strategies to recall daily and new information with Mod assist verbal cues   Skilled Therapeutic Interventions: Skilled treatment session focused on cognitive goals. SLP facilitated session by providing Max A multimodal cues for intellectual awareness of deficits and their impact on her overall function and safety. Patient perseverative on paying bills and smoking a cigarette and required Max verbal cues for redirection to task for ~5 minute intervals. Patient also performed self-care tasks at the sink with Mod verbal cues due to visual impairments. Patient left upright in wheelchair with quick release belt in place and RN present. Continue with current plan of care.      Function:   Cognition Comprehension Comprehension assist level: Understands basic 50 - 74% of the time/ requires cueing 25 - 49% of the time  Expression   Expression assist level: Expresses basic 50 - 74% of the time/requires cueing 25 - 49% of the time. Needs to repeat parts of sentences.  Social Interaction Social Interaction assist level: Interacts appropriately 25 - 49% of time - Needs frequent redirection.  Problem Solving Problem solving assist level: Solves basic 50 - 74% of the time/requires cueing 25 - 49% of the time  Memory Memory assist  level: Recognizes or recalls less than 25% of the time/requires cueing greater than 75% of the time    Pain No/Denies Pain   Therapy/Group: Individual Therapy  Herb Beltre 01/24/2017, 3:28 PM

## 2017-01-24 NOTE — Progress Notes (Signed)
Occupational Therapy Session Note  Patient Details  Name: [name] MRN: [id number] Date of Birth: Nov 10, 1966  Today's Date: 01/24/2017 OT Individual Time: 1100-1158 OT Individual Time Calculation (min): 58 min    Short Term Goals: Week 1:  OT Short Term Goal 1 (Week 1): Pt will don pants using reacher and Min A OT Short Term Goal 2 (Week 1): Pt will tolerate 5 minutes standing at the sink in preparation for ADL tasks OT Short Term Goal 3 (Week 1): Pt will sequence 3 step ADL with min verbal cues  Skilled Therapeutic Interventions/Progress Updates:    Pt resting in bed upon arrival.  Pt requested to take a shower.  Discussed with pt that her Bledsoe brace would need to remain on her LLE and would be covered with a plastic bag.  Pt agreeable.  Pt transferred to w/c with steady A to locate w/c.  Pt requested to use toilet and transferred with supervision.  Pt amb with HHA from toilet to tub bench for shower.  Pt required assistance to bathe L foot without AE but completed remainder of bathing without assistance.  Pt donned underpants, shirt, and socks without assistance.  Pt donned hospital gown 2/2 pt does not have any clothing.  Pt applied body lotion and returned to bed with all needs within reach and bed alarm activated.  Focus on functional transfers, sit<>stand, functional amb without AD, standing balance, and safety awareness to increase independence with BADLs.   Therapy Documentation Precautions:  Precautions Precautions: Fall Precaution Comments: Pt is visually impaired at baseline Required Braces or Orthoses: Other Brace/Splint Other Brace/Splint: Hinged knee brace- unrestricted AROM at L knee Restrictions Weight Bearing Restrictions: Yes LLE Weight Bearing: Weight bearing as tolerated Other Position/Activity Restrictions: Hinged Knee Brace General:   Vital Signs: Therapy Vitals BP: 119/60 Pain: Pain Assessment Pain Assessment: 0-10 Pain Score: 3  Pain Type: Chronic  pain Pain Location: Head Pain Descriptors / Indicators: Aching Pain Frequency: Constant Pain Onset: On-going Patients Stated Pain Goal: 4 Pain Intervention(s): Medication (See eMAR) ADL: ADL ADL Comments: Please see functional navigator Exercises:   Other Treatments:    See Function Navigator for Current Functional Status.   Therapy/Group: Individual Therapy  Rich Brave 01/24/2017, 12:06 PM

## 2017-01-24 NOTE — Plan of Care (Signed)
Problem: RH Eating Goal: LTG Patient will perform eating w/assist, cues/equip (OT) LTG: Patient will perform eating with assist, with/without cues using equipment (OT)  Goal downgraded to supervision for safety- ESD 01/24/17  Problem: RH Grooming Goal: LTG Patient will perform grooming w/assist,cues/equip (OT) LTG: Patient will perform grooming with assist, with/without cues using equipment (OT)  Goal downgraded to supervision for safety- ESD 01/24/17  Problem: RH Bathing Goal: LTG Patient will bathe with assist, cues/equipment (OT) LTG: Patient will bathe specified number of body parts with assist with/without cues using equipment (position)  (OT)  Goal downgraded to supervision for safety- ESD 01/24/17  Problem: RH Dressing Goal: LTG Patient will perform upper body dressing (OT) LTG Patient will perform upper body dressing with assist, with/without cues (OT).  Goal downgraded to supervision for safety- ESD 01/24/17 Goal: LTG Patient will perform lower body dressing w/assist (OT) LTG: Patient will perform lower body dressing with assist, with/without cues in positioning using equipment (OT)  Goal downgraded to supervision for safety- ESD 01/24/17  Problem: RH Toileting Goal: LTG Patient will perform toileting w/assist, cues/equip (OT) LTG: Patient will perform toiletiing (clothes management/hygiene) with assist, with/without cues using equipment (OT)  Goal downgraded to supervision for safety- ESD 01/24/17  Comments: Goal downgraded to supervision for safety- ESD 01/24/17

## 2017-01-24 NOTE — Progress Notes (Signed)
Physical Therapy Session Note  Patient Details  Name: [name] MRN: [id number] Date of Birth: Sep 05, 1966  Today's Date: 01/24/2017 PT Individual Time: 1300-1400 PT Individual Time Calculation (min): 60 min   Short Term Goals: Week 1:  PT Short Term Goal 1 (Week 1): pt will attend to functional task in distracting environment x10 minutes with min verbal cues.   PT Short Term Goal 2 (Week 1): Pt will demonstrate recall of previous PT session with mod question cues PT Short Term Goal 3 (Week 1): Pt will demonstrate dynamic standing balance in unfamiliar environment with min assist.   Skilled Therapeutic Interventions/Progress Updates:    Session focused on functional mobility in home environment and community environment with cognitive remediation to address attention, memory, awareness, safety, and orientation. Mod to max cues for cognition throughout session with patient perseverating on getting her money from "the safe" and wanting to "get my cigarette." Pt able to be redirected but continues to be tangential throughout session. Pt completed basic transfers during session including toileting with overall steadying assist and manual and verbal cues for obstacle negotiation due to visual impairment. In community setting, in gift shop, completed at min assist ambulatory level with focus on creating "a list" of items she wanted to purchase with PT writing down price and working on patient recalling items and relative prices during activity with max assist. Pt propelled w/c on unit with min physical assist and total cues for obstacle negotiation due to visual impairment.   Therapy Documentation Precautions:  Precautions Precautions: Fall Precaution Comments: Pt is visually impaired at baseline Required Braces or Orthoses: Other Brace/Splint Other Brace/Splint: Hinged knee brace- unrestricted AROM at L knee Restrictions Weight Bearing Restrictions: Yes LLE Weight Bearing: Weight bearing as  tolerated Other Position/Activity Restrictions: Hinged Knee Brace   Pain: Reports intermittent discomfort in LLE - pt able to state she fractured that leg.   See Function Navigator for Current Functional Status.   Therapy/Group: Individual Therapy  Karolee Stamps Darrol Poke, PT, DPT  01/24/2017, 2:07 PM

## 2017-01-24 NOTE — Progress Notes (Signed)
Occupational Therapy Session Note  Patient Details  Name: [name] MRN: [id number] Date of Birth: [birth year]  Today's Date: 01/24/2017 OT Individual Time: 0930-1000 and 1415-1445  OT Individual Time Calculation (min): 30 min and 30 min    Short Term Goals: Week 1:  OT Short Term Goal 1 (Week 1): Pt will don pants using reacher and Min A OT Short Term Goal 2 (Week 1): Pt will tolerate 5 minutes standing at the sink in preparation for ADL tasks OT Short Term Goal 3 (Week 1): Pt will sequence 3 step ADL with min verbal cues  Skilled Therapeutic Interventions/Progress Updates:   Session 1: Upon entering the room, pt seated in wheelchair with 3/10 pain described as headache. Pt reports recently receiving medications. Pt oriented to self and location. She was also able to correctly state month and year. Pt unable to verbalize why she was at hospital or what events happened prior to admission. Pt states, " I don't know what happened to me." Pt very focused on calling attorney and needing max cues to redirect conversation. Tangential speech throughout session. Pt unable to demonstrate how to call RN. OT utilized gauze to build up call button in order to allow pt to easily locate secondary to visual deficits. Pt demonstrated ability to use call bell. Pt remained in wheelchair with quick release belt donned and call bell within reach.   Session 2: Upon entering the room, pt seated in wheelchair with no c/o pain this session. Pt perseverating on items she found in gift shop and wishes to purchase needing mod cues to redirect. Pt requesting to don pants. Pt attempting to thread onto L LE but needing assistance. She stood from wheelchair with steady assistance. Steady balance assistance when standing to pull pants over B hips. Pt removed hospital gown as it was soiled and she had shirt on underneath. Pt remained seated in wheelchair with quick release belt donned and soft call bell within reach upon exiting  the room.   Therapy Documentation Precautions:  Precautions Precautions: Fall Precaution Comments: Pt is visually impaired at baseline Required Braces or Orthoses: Other Brace/Splint Other Brace/Splint: Hinged knee brace- unrestricted AROM at L knee Restrictions Weight Bearing Restrictions: Yes LLE Weight Bearing: Weight bearing as tolerated Other Position/Activity Restrictions: Hinged Knee Brace General:   Vital Signs: Therapy Vitals BP: 119/60 Pain: Pain Assessment Pain Assessment: 0-10 Pain Score: 3  Pain Type: Chronic pain Pain Location: Head Pain Descriptors / Indicators: Aching Pain Frequency: Constant Pain Onset: On-going Patients Stated Pain Goal: 4 Pain Intervention(s): Medication (See eMAR) ADL: ADL ADL Comments: Please see functional navigator Exercises:   Other Treatments:    See Function Navigator for Current Functional Status.   Therapy/Group: Individual Therapy  Alen Bleacher 01/24/2017, 12:24 PM

## 2017-01-24 NOTE — Progress Notes (Signed)
Patient information reviewed and entered into [facility] system by Kiyona Mcnall, RN, CRRN, PPS Coordinator.  Information including medical coding and functional independence measure will be reviewed and updated through discharge.    

## 2017-01-25 ENCOUNTER — Inpatient Hospital Stay (HOSPITAL_COMMUNITY): Payer: Medicaid Other

## 2017-01-25 ENCOUNTER — Inpatient Hospital Stay (HOSPITAL_COMMUNITY): Payer: Medicaid Other | Admitting: Occupational Therapy

## 2017-01-25 ENCOUNTER — Inpatient Hospital Stay (HOSPITAL_COMMUNITY): Payer: Medicaid Other | Admitting: Speech Pathology

## 2017-01-25 ENCOUNTER — Inpatient Hospital Stay (HOSPITAL_COMMUNITY): Payer: Medicaid Other | Admitting: Physical Therapy

## 2017-01-25 NOTE — Progress Notes (Signed)
Speech Language Pathology Daily Session Note  Patient Details  Name: [name] MRN: [id number] Date of Birth: Aug 11, 1966  Today's Date: 01/25/2017 SLP Individual Time: 0815-0900 SLP Individual Time Calculation (min): 45 min  Short Term Goals: Week 1: SLP Short Term Goal 1 (Week 1): Patient will sustain attention to functional tasks for 15-20 minutes Min assist cues for redirection  SLP Short Term Goal 2 (Week 1): Patient will solve basic problems related to self-care with Min assist verbal cues  SLP Short Term Goal 3 (Week 1): Patient will identify 2 physical and 2 cognitive deficits post injury SLP Short Term Goal 4 (Week 1): Patient will utilize external aids and/or memory strategies to recall daily and new information with Mod assist verbal cues   Skilled Therapeutic Interventions: Skilled treatment session focused on cognitive goals. Throughout session, patient extremely confused wit intermittent confabulation and paranoia. SLP facilitated session by verbalizing timeline of current hospitalization and events and providing Max A verbal cues for emergent awareness of deficits and their impact on her function.  Patient required frequent redirection throughout session and demonstrated sustained attention to topic of conversation for ~ 2 turns. Patient left upright in wheelchair with all needs within reach. Continue with current plan of care.      Function:   Cognition Comprehension Comprehension assist level: Understands basic 50 - 74% of the time/ requires cueing 25 - 49% of the time  Expression   Expression assist level: Expresses basic 50 - 74% of the time/requires cueing 25 - 49% of the time. Needs to repeat parts of sentences.  Social Interaction Social Interaction assist level: Interacts appropriately 25 - 49% of time - Needs frequent redirection.  Problem Solving Problem solving assist level: Solves basic 25 - 49% of the time - needs direction more than half the time to initiate,  plan or complete simple activities  Memory Memory assist level: Recognizes or recalls less than 25% of the time/requires cueing greater than 75% of the time    Pain No/Denies Pain   Therapy/Group: Individual Therapy  [name] 01/25/2017, 3:09 PM
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
## 2017-01-25 NOTE — Progress Notes (Signed)
Physical Therapy Note  Patient Details  Name: [name] MRN: [id number] Date of Birth: Mar 16, 1966 Today's Date: 01/25/2017  0905-1005, 60 min individual tx Pain: "hurts a lot when I move it" (LLE)  PT cleaned pt's glasses and she wore them intermittently, stating they are for close vision, but removing them often.  .Pt oriented to month, year, situation. Pt ate breakfast sitting in w/c.  She asked "where is the pepperoni", and later, "  is that salmon?" when smelling plate of bacon and french toast.  W/c propulsion using bil UEs x 150' with mod assist for steering due to vision deficits.  Pt stated she could see open doorways, but was unable to see other environment objects.  Upon entering gym, pt asked "are we in a [place] restaurant?"  Pt re-oriented.  Gait without AD x 200' on level tile, with multiple turns, min hand hold assist, except for 1 instance of assistance for LOB due to pain LLE.  Education regarding Bledsoe brace, and reposiiotning of it in supine. Pt left resting in w/c with quick release belt applied and all needs within reach. [name], NT entering room.  See function navigator for current status.  Alva Kuenzel 01/25/2017, 7:59 AM

## 2017-01-25 NOTE — Progress Notes (Signed)
Occupational Therapy Session Note  Patient Details  Name: [name] MRN: [id number] Date of Birth: 1966/07/03  Today's Date: 01/25/2017 OT Individual Time: 1500-1555 OT Individual Time Calculation (min): 55 min    Short Term Goals: Week 1:  OT Short Term Goal 1 (Week 1): Pt will don pants using reacher and Min A OT Short Term Goal 2 (Week 1): Pt will tolerate 5 minutes standing at the sink in preparation for ADL tasks OT Short Term Goal 3 (Week 1): Pt will sequence 3 step ADL with min verbal cues  Skilled Therapeutic Interventions/Progress Updates:    Upon entering the room, pt seated in wheelchair with no c/o pain. Pt attempting to slip quick release belt over her head. OT educating pt on purpose of belt for safety. Pt perseverating on a friend that she doesn't want to visit her. Pt oriented to self and date only. Pt stating, "I don't know" when asked where she was and why she was at hospital. OT orienting pt to current situation. Pt standing from wheelchair with supervision and ambulating with hand held assistance to sink. Pt standing for 5 minutes for grooming tasks with steady assistance and set up A to obtain needed items. Pt ambulating 50' with min HHA to obtain brief. Once in room, pt seated on wheelchair and attempting to don mesh underwear but needing mod A for task. While seated, pt utilized body lotion on B UE's and R LE. Pt requesting to return to bed at end of session. Bed alarm activated and soft call bell within pt's reach.   Therapy Documentation Precautions:  Precautions Precautions: Fall Precaution Comments: Pt is visually impaired at baseline Required Braces or Orthoses: Other Brace/Splint Other Brace/Splint: Hinged knee brace- unrestricted AROM at L knee Restrictions Weight Bearing Restrictions: Yes LLE Weight Bearing: Weight bearing as tolerated Other Position/Activity Restrictions: Hinged Knee Brace General:   Vital Signs: Therapy Vitals Temp: 98.9 F (37.2  C) Temp Source: Oral Pulse Rate: (!) 105 (RN notified) Resp: 18 BP: (!) 99/56 (RN notified) Patient Position (if appropriate): Sitting Oxygen Therapy SpO2: 100 % O2 Device: Not Delivered Pain: Pain Assessment Pain Assessment: 0-10 Pain Score: 8  Faces Pain Scale: Hurts whole lot Pain Type: Chronic pain Pain Location: Back Pain Orientation: Lower;Mid PAINAD (Pain Assessment in Advanced Dementia) Breathing: normal ADL: ADL ADL Comments: Please see functional navigator Exercises:   Other Treatments:    See Function Navigator for Current Functional Status.   Therapy/Group: Individual Therapy  Alen Bleacher 01/25/2017, 5:13 PM
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
## 2017-01-25 NOTE — Progress Notes (Signed)
Physical Therapy Session Note  Patient Details  Name: [name] MRN: [id number] Date of Birth: [birth year]  Today's Date: 01/25/2017 PT Individual Time: 1350-1430 PT Individual Time Calculation (min): 40 min   Short Term Goals: Week 1:  PT Short Term Goal 1 (Week 1): pt will attend to functional task in distracting environment x10 minutes with min verbal cues.   PT Short Term Goal 2 (Week 1): Pt will demonstrate recall of previous PT session with mod question cues PT Short Term Goal 3 (Week 1): Pt will demonstrate dynamic standing balance in unfamiliar environment with min assist.   Skilled Therapeutic Interventions/Progress Updates:  Pt received in w/c & agreeable to tx. Pt with varying reports of current vision, reporting she cannot see flowers in front of her but is able to read numbers on telephone. Pt with bra around L elbow & required cuing from therapist to realize this. Pt able to don bra with supervision assistance. Transported pt to gym via w/c total assist and pt completed stand pivot w/c<>nu-step with steady assist. Pt utilized nu-step with all four extremities on level 1 x 10 minutes with consistent cuing to attend to task. Throughout session pt with constant tangential conversation and required max cuing for orientation with significant time spent orienting pt; pt unable to recall location or reason for hospitalization. At end of session pt left sitting in w/c in room with QRB & chair alarm donned, all needs within reach.   Therapy Documentation Precautions:  Precautions Precautions: Fall Precaution Comments: Pt is visually impaired at baseline Required Braces or Orthoses: Other Brace/Splint Other Brace/Splint: Hinged knee brace- unrestricted AROM at L knee Restrictions Weight Bearing Restrictions: Yes LLE Weight Bearing: Weight bearing as tolerated Other Position/Activity Restrictions: Hinged Knee Brace  Pain: No c/o pain.   See Function Navigator for Current Functional  Status.   Therapy/Group: Individual Therapy  Sandi Mariscal 01/25/2017, 5:14 PM

## 2017-01-26 ENCOUNTER — Inpatient Hospital Stay (HOSPITAL_COMMUNITY): Payer: Medicaid Other

## 2017-01-26 ENCOUNTER — Inpatient Hospital Stay (HOSPITAL_COMMUNITY): Payer: Medicaid Other | Admitting: Occupational Therapy

## 2017-01-26 ENCOUNTER — Inpatient Hospital Stay (HOSPITAL_COMMUNITY): Payer: Medicaid Other | Admitting: Physical Therapy

## 2017-01-26 ENCOUNTER — Inpatient Hospital Stay (HOSPITAL_COMMUNITY): Payer: Medicaid Other | Admitting: Speech Pathology

## 2017-01-26 MED ORDER — LORAZEPAM 2 MG/ML IJ SOLN
0.5000 mg | Freq: Once | INTRAMUSCULAR | Status: AC
  Administered 2017-01-26: 0.5 mg via INTRAMUSCULAR
  Filled 2017-01-26: qty 1

## 2017-01-26 NOTE — Progress Notes (Signed)
Occupational Therapy Session Note  Patient Details  Name: [name] MRN: [id number] Date of Birth: 03-Jul-1966  Today's Date: 01/26/2017 OT Individual Time: 1030-1100 OT Individual Time Calculation (min): 30 min    Short Term Goals: Week 1:  OT Short Term Goal 1 (Week 1): Pt will don pants using reacher and Min A OT Short Term Goal 2 (Week 1): Pt will tolerate 5 minutes standing at the sink in preparation for ADL tasks OT Short Term Goal 3 (Week 1): Pt will sequence 3 step ADL with min verbal cues  Skilled Therapeutic Interventions/Progress Updates: 1:1 Pt looking through her purse and trying to call the bank " to find out how much money I have." Able to redirect her to the sink to engage in self care tasks. Pt bathed at the sink with max cues to complete all parts (excluding lower legs). Pt required total A to orient shirt and don correctly.  Pt self distracting requiring max A to sustained attention during all tasks. Total A for dressing left LE but pt able to complete task with steadying A. Sit to stands are with close supervision.   2nd session: 13:45-14:45 When entered the room, pt yelling profanities at the nurse. Pt redirected to a quite room and engaged in activity to address sustained attention and orientation with functional activity. Pt required total A to remain on task. Pt unable to name any items in a familiar category. Pt required total A for orientation and did not hold on to or accept any of the information being shared. Pt did engage in conversation about biographical information with normal voice tone. Came to RN station and sat with pt until next therapy session; listening to music.     Therapy Documentation Precautions:  Precautions Precautions: Fall Precaution Comments: Pt is visually impaired at baseline Required Braces or Orthoses: Other Brace/Splint Other Brace/Splint: Hinged knee brace- unrestricted AROM at L knee Restrictions Weight Bearing Restrictions: Yes LLE  Weight Bearing: Weight bearing as tolerated Other Position/Activity Restrictions: Hinged Knee Brace   Pain:  no c/o pain in session (either session ) See Function Navigator for Current Functional Status.   Therapy/Group: Individual Therapy  Roney Mans [facility] 01/26/2017, 2:56 PM
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
## 2017-01-26 NOTE — Plan of Care (Signed)
Problem: RH SAFETY Goal: RH STG ADHERE TO SAFETY PRECAUTIONS W/ASSISTANCE/DEVICE STG Adhere to Safety Precautions With supervision/cues Assistance/Device.   Outcome: Not Progressing Continues to get up without knowing of any danger.

## 2017-01-26 NOTE — Progress Notes (Signed)
Speech Language Pathology Daily Session Note  Patient Details  Name: [name] MRN: [id number] Date of Birth: 1966/06/17  Today's Date: 01/26/2017 SLP Individual Time: 1300-1330 SLP Individual Time Calculation (min): 30 min  Short Term Goals: Week 1: SLP Short Term Goal 1 (Week 1): Patient will sustain attention to functional tasks for 15-20 minutes Min assist cues for redirection  SLP Short Term Goal 2 (Week 1): Patient will solve basic problems related to self-care with Min assist verbal cues  SLP Short Term Goal 3 (Week 1): Patient will identify 2 physical and 2 cognitive deficits post injury SLP Short Term Goal 4 (Week 1): Patient will utilize external aids and/or memory strategies to recall daily and new information with Mod assist verbal cues   Skilled Therapeutic Interventions: Skilled treatment session focused on cognitive goals. SLP facilitated session by providing total A for orientation to place multiple times throughout session. Patient declined use of utensils while self-feeding lunch meal of a salad and proceeded to try to eat paper and other inedible items on lunch tray. Patient continues to demonstrate perseveration, confabulation with confusion and was verbose with language of confusion throughout entire session. Overall, patient required Max A verbal cues for sustained attention to task for ~60 seconds. Patient left upright in wheelchair with quick release belt in place and chair alarm on. Continue with current plan of care.      Function:    Cognition Comprehension Comprehension assist level: Understands basic 50 - 74% of the time/ requires cueing 25 - 49% of the time  Expression   Expression assist level: Expresses basic 50 - 74% of the time/requires cueing 25 - 49% of the time. Needs to repeat parts of sentences.  Social Interaction Social Interaction assist level: Interacts appropriately 25 - 49% of time - Needs frequent redirection.  Problem Solving Problem solving  assist level: Solves basic 25 - 49% of the time - needs direction more than half the time to initiate, plan or complete simple activities  Memory Memory assist level: Recognizes or recalls less than 25% of the time/requires cueing greater than 75% of the time    Pain No/Denies Pain   Therapy/Group: Individual Therapy  Domenic Schoenberger 01/26/2017, 3:15 PM
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
## 2017-01-26 NOTE — Care Management Note (Signed)
[facility] Individual Statement of Services  Patient Name:  [name]  Date:  01/24/2017  Welcome to the [facility]patient [facility].  Our goal is to provide you with an individualized program based on your diagnosis and situation, designed to meet your specific needs.  With this comprehensive rehabilitation program, you will be expected to participate in at least 3 hours of rehabilitation therapies Monday-Friday, with modified therapy programming on the weekends.  Your rehabilitation program will include the following services:  Physical Therapy (PT), Occupational Therapy (OT), Speech Therapy (ST), 24 hour per day rehabilitation nursing, Therapeutic Recreaction (TR), Neuropsychology, Case Management (Social Worker), Rehabilitation Medicine, Nutrition Services and Pharmacy Services  Weekly team conferences will be held on Tuesdays to discuss your progress.  Your Social Worker will talk with you frequently to get your input and to update you on team discussions.  Team conferences with you and your family in attendance may also be held.  Expected length of stay: 14 days  Overall anticipated outcome: supervision  Depending on your progress and recovery, your program may change. Your Social Worker will coordinate services and will keep you informed of any changes. Your Social Worker's name and contact numbers are listed  below.  The following services may also be recommended but are not provided by the Inpatient [facility] Center:   Driving Evaluations  Home Health Rehabiltiation Services  Outpatient Rehabilitation Services  Vocational Rehabilitation   Arrangements will be made to provide these services after discharge if needed.  Arrangements include referral to agencies that provide these services.  Your insurance has been verified to be:  Medicaid Your primary doctor is:  Unknown - Child psychotherapist to verify  Pertinent information will be shared with your  doctor and your insurance company.  Social Worker:  [place], [place] [phone] or ([phone]   Information discussed with and copy given to patient by: Amada Jupiter, 01/24/2017, 4:29 PM
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
## 2017-01-26 NOTE — Progress Notes (Signed)
Physical Therapy Note  Patient Details  Name: [name] MRN: [id number] Date of Birth: 09/29/1966 Today's Date: 01/26/2017  8916-9450, 75 min individual tx Pain: none reported  Pt very excited, verbose, confabulating about being in [employer] and wanting to buy some "cute clothes:.  Pt sang frequently during session, but responded to requests to decrease volume.  Pt resistant to having L Bledsoe brace donned, but eventually agreed.  Pt reported she needed to use toilet; gait with bil UE support via hand hold assit,  in room and toilet transfer for continent voiding.  Pt ate breakfast seated in w/c with set up assist.  Pt oriented to month, and year with 2 choices.  Pt is very focused on her upcoming birthday, and correctly stated she will be 51 years old.   neuromuscular re-education via forced use, multimodal cues for alternating reciprocal movement x 4 extremities on NuStep at level 5 x 5 minutes.    Gait on level tile, carpet and up/down 12 steps 2 rails with min assist.  Pt's gait antalgic today.  Pt left resting in w/c with quick release belt applied, chair alarm set,  and all needs within reach.  See function navigator for current status.   Kavish Lafitte 01/26/2017, 7:54 AM
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
## 2017-01-26 NOTE — Progress Notes (Signed)
Social Work Patient ID: [name], female   DOB: [birth year], 51 y.o.   MRN: [id number]   Met with pt's sister yesterday to review pt's information as pt had provided and team conference info.  Sister very pleasant but reports that she is not able to provide any assistance to pt at d/c.  She confirms what pt had reported re: "[name]" who was identified as the caregiver by [facility] - he is not to be a caregiver nor be involved in pt's case in any way.  Sister reports that pt does NOT have 24/7 support and that pt will need to d/c to SNF.  Attempted to address this with pt today, however, she is significantly confused and not able to carry fluid conversation.  Will try again with her tomorrow.  Beginning search for SNF but will need to be able to remove the restraints we have currently if pt safety will allow.  Karis Emig, LCSW

## 2017-01-27 ENCOUNTER — Inpatient Hospital Stay (HOSPITAL_COMMUNITY): Payer: Medicaid Other | Admitting: Physical Therapy

## 2017-01-27 ENCOUNTER — Inpatient Hospital Stay (HOSPITAL_COMMUNITY): Payer: Medicaid Other | Admitting: Speech Pathology

## 2017-01-27 ENCOUNTER — Inpatient Hospital Stay (HOSPITAL_COMMUNITY): Payer: Medicaid Other

## 2017-01-27 ENCOUNTER — Inpatient Hospital Stay (HOSPITAL_COMMUNITY): Payer: Medicaid Other | Admitting: Occupational Therapy

## 2017-01-27 LAB — URINALYSIS, ROUTINE W REFLEX MICROSCOPIC
BILIRUBIN URINE: NEGATIVE
Glucose, UA: NEGATIVE mg/dL
HGB URINE DIPSTICK: NEGATIVE
KETONES UR: NEGATIVE mg/dL
Leukocytes, UA: NEGATIVE
NITRITE: NEGATIVE
PH: 6 (ref 5.0–8.0)
Protein, ur: NEGATIVE mg/dL
Specific Gravity, Urine: 1.016 (ref 1.005–1.030)

## 2017-01-27 LAB — BASIC METABOLIC PANEL
Anion gap: 7 (ref 5–15)
BUN: 18 mg/dL (ref 6–20)
CALCIUM: 10 mg/dL (ref 8.9–10.3)
CO2: 30 mmol/L (ref 22–32)
CREATININE: 0.79 mg/dL (ref 0.44–1.00)
Chloride: 102 mmol/L (ref 101–111)
GFR calc non Af Amer: >60 mL/min (ref >60)
GLUCOSE: 112 mg/dL — AB (ref 65–99)
Potassium: 3.5 mmol/L (ref 3.5–5.1)
Sodium: 139 mmol/L (ref 135–145)

## 2017-01-27 LAB — CBC WITH DIFFERENTIAL/PLATELET
BASOS PCT: 0 %
Basophils Absolute: 0 10*3/uL (ref 0.0–0.1)
EOS ABS: 0.1 10*3/uL (ref 0.0–0.7)
EOS PCT: 2 %
HCT: 33.3 % — ABNORMAL LOW (ref 36.0–46.0)
Hemoglobin: 10.9 g/dL — ABNORMAL LOW (ref 12.0–15.0)
Lymphocytes Relative: 26 %
Lymphs Abs: 1.8 10*3/uL (ref 0.7–4.0)
MCH: 29.4 pg (ref 26.0–34.0)
MCHC: 32.7 g/dL (ref 30.0–36.0)
MCV: 89.8 fL (ref 78.0–100.0)
MONO ABS: 0.4 10*3/uL (ref 0.1–1.0)
MONOS PCT: 6 %
Neutro Abs: 4.6 10*3/uL (ref 1.7–7.7)
Neutrophils Relative %: 66 %
Platelets: 267 10*3/uL (ref 150–400)
RBC: 3.71 MIL/uL — ABNORMAL LOW (ref 3.87–5.11)
RDW: 13.4 % (ref 11.5–15.5)
WBC: 6.9 10*3/uL (ref 4.0–10.5)

## 2017-01-27 MED ORDER — DIVALPROEX SODIUM 250 MG PO DR TAB
250.0000 mg | DELAYED_RELEASE_TABLET | Freq: Two times a day (BID) | ORAL | Status: DC
  Administered 2017-01-27 – 2017-02-02 (×13): 250 mg via ORAL
  Filled 2017-01-27 (×13): qty 1

## 2017-01-27 NOTE — Progress Notes (Signed)
Occupational Therapy Session Note  Patient Details  Name: [name] MRN: [id number] Date of Birth: September 01, 1966  Today's Date: 01/27/2017 OT Individual Time: 1030-1130 OT Individual Time Calculation (min): 60 min    Short Term Goals: Week 1:  OT Short Term Goal 1 (Week 1): Pt will don pants using reacher and Min A OT Short Term Goal 2 (Week 1): Pt will tolerate 5 minutes standing at the sink in preparation for ADL tasks OT Short Term Goal 3 (Week 1): Pt will sequence 3 step ADL with min verbal cues  Skilled Therapeutic Interventions/Progress Updates:    Pt resting in gerichair at nursing station upon arrival.  Pt initially engaged in grooming tasks with sit<>stand from chair at sink.  Pt requires steady A for sit<>stand and steady A for standing balance.  Pt requested use of bathroom and amb with HHA (min A) to bathroom.  Pt completed toileting tasks with steady A.  Pt transitioned to standing balance activities modified for visual deficits.  Pt requires max verbal cues for attention to task.  Pt internally distracted and is tangential during conversation.  Pt requires max verbal cues for orientation.  Pt exhibits impaired memory and requires max verbal cues to recall earlier events and conversations.  Pt remained in gerichair at nursing station.   Therapy Documentation Precautions:  Precautions Precautions: Fall Precaution Comments: Pt is visually impaired at baseline Required Braces or Orthoses: Other Brace/Splint Other Brace/Splint: Hinged knee brace- unrestricted AROM at L knee Restrictions Weight Bearing Restrictions: Yes LLE Weight Bearing: Weight bearing as tolerated Other Position/Activity Restrictions: Hinged Knee Brace Pain: Pain Assessment Pain Score: 0-No pain  See Function Navigator for Current Functional Status.   Therapy/Group: Individual Therapy  Rich Brave 01/27/2017, 2:38 PM

## 2017-01-27 NOTE — Progress Notes (Signed)
Speech Language Pathology Weekly Progress and Session Note  Patient Details  Name: [name] MRN: [id number] Date of Birth: 1966-04-30  Beginning of progress report period: January 20, 2017 End of progress report period: January 27, 2017  Today's Date: 01/27/2017 SLP Individual Time: 0900-1000 SLP Individual Time Calculation (min): 60 min  Short Term Goals: Week 1: SLP Short Term Goal 1 (Week 1): Patient will sustain attention to functional tasks for 15-20 minutes Min assist cues for redirection  SLP Short Term Goal 1 - Progress (Week 1): Not met SLP Short Term Goal 2 (Week 1): Patient will solve basic problems related to self-care with Min assist verbal cues  SLP Short Term Goal 2 - Progress (Week 1): Not met SLP Short Term Goal 3 (Week 1): Patient will identify 2 physical and 2 cognitive deficits post injury SLP Short Term Goal 3 - Progress (Week 1): Not met SLP Short Term Goal 4 (Week 1): Patient will utilize external aids and/or memory strategies to recall daily and new information with Mod assist verbal cues  SLP Short Term Goal 4 - Progress (Week 1): Not met    New Short Term Goals: Week 2: SLP Short Term Goal 1 (Week 2): Patient will demonstrate sustained attention to tasks for ~5 minutes with Max A verbal cues for redirection.  SLP Short Term Goal 2 (Week 2): Patient will orient to place and situation with Max A multimodal cues.  SLP Short Term Goal 3 (Week 2): Patient will demonstrate intellectual awareness of physical and cognitive deficits with Max A multimodal cues. SLP Short Term Goal 4 (Week 2): Patient will demonstrate functional problem solving with Mod A multimodal cues with self-care tasks.   Weekly Progress Updates: Patient has made minimal gains and has not met any STG's this reporting period. Currently, patient is demonstrating behaviors consistent with a Rancho Level V and requires overall Total A to complete functional and familiar tasks safely in regards to orientation,  problem solving, awareness, and recall. Patient continues to demonstrate perseveration, confabulation and language of confusion with intermittent restlessness and paranoia. Patient would benefit from continued skilled SLP intervention to maximize her cognitive function and overall functional independence prior to discharge.      Intensity: Minumum of 1-2 x/day, 30 to 90 minutes Frequency: 3 to 5 out of 7 days Duration/Length of Stay: TBD due to SNF placement  Treatment/Interventions: Cognitive remediation/compensation;Cueing hierarchy;Environmental controls;Functional tasks;Internal/external aids;Patient/family education;Therapeutic Activities   Daily Session  Skilled Therapeutic Interventions: Skilled treatment session focused on cognitive goals. SLP facilitated session by providing total A for orientation to place and situation multiple times throughout session.  Patient continues to demonstrate perseveration, confabulation with confusion and was verbose with language of confusion throughout entire session. Overall, patient required Max A verbal cues for sustained attention to task for ~60 seconds and total A to complete all self-care tasks safely. Patient left upright in [place] with quick release belt in place at [employer] station. Continue with current plan of care.       Function:    Cognition Comprehension Comprehension assist level: Understands basic 50 - 74% of the time/ requires cueing 25 - 49% of the time  Expression   Expression assist level: Expresses basic 50 - 74% of the time/requires cueing 25 - 49% of the time. Needs to repeat parts of sentences.  Social Interaction Social Interaction assist level: Interacts appropriately 25 - 49% of time - Needs frequent redirection.  Problem Solving Problem solving assist level: Solves basic 25 -  49% of the time - needs direction more than half the time to initiate, plan or complete simple activities  Memory Memory assist level: Recognizes or  recalls less than 25% of the time/requires cueing greater than 75% of the time   Pain No/denies Pain   Therapy/Group: Individual Therapy  Marisha Renier 01/27/2017, 4:12 PM

## 2017-01-27 NOTE — Progress Notes (Signed)
[place] PHYSICAL MEDICINE & REHABILITATION     PROGRESS NOTE  Subjective/Complaints:  Up in chair. Wants to know why she's here. Didn't realize where she was  ROS: Limited due cognitive/behavioral    Objective: Vital Signs: Blood pressure 128/78, pulse (!) 108, temperature 98.6 F (37 C), temperature source Oral, resp. rate 18, height 5' (1.524 m), weight 68.8 kg (151 lb 9.6 oz), SpO2 98 %. No results found. No results for input(s): WBC, HGB, HCT, PLT in the last 72 hours. No results for input(s): NA, K, CL, GLUCOSE, BUN, CREATININE, CALCIUM in the last 72 hours.  Invalid input(s): CO CBG (last 3)  No results for input(s): GLUCAP in the last 72 hours.  Wt Readings from Last 3 Encounters:  01/20/17 68.8 kg (151 lb 9.6 oz)  01/16/17 63.8 kg (140 lb 10.5 oz)    Physical Exam:  BP 128/78 (BP Location: Left Arm)   Pulse (!) 108   Temp 98.6 F (37 C) (Oral)   Resp 18   Ht 5' (1.524 m)   Wt 68.8 kg (151 lb 9.6 oz)   LMP  (LMP Unknown)   SpO2 98%   BMI 29.61 kg/m  Constitutional: She appears well-developed and well-nourished.  HENT:  Face healing Eyes: EOM are normal. anicteric.    Cardiovascular: RRR. Respiratory: CTA B. Normal effort  GI: Soft. Bowel sounds are normal.  Musculoskeletal: No edema. Tenderness LLE Neurological: A&O to person. Seems more distracted and restless today. Remains confused. Motor: 5/5 bilateral deltoid, bicep, tricep, grip, right hip flexion, knee extension, ankle dorsiflexion, plantarflexion.--no change RLE: HF, KE 4-/5, 5/5 ADF/PF LLE: HF, NE 3/5, ADF/PF 3+/5---exam stable  Skin: Skin is warm and dry.   Assessment/Plan: 1. Functional deficits secondary to cognitive deficits to TBI/traumatic SDH, left fibular fracture which require 3+ hours per day of interdisciplinary therapy in a comprehensive inpatient rehab setting. Physiatrist is providing close team supervision and 24 hour management of active medical problems listed  below. Physiatrist and rehab team continue to assess barriers to discharge/monitor patient progress toward functional and medical goals.  Function:  Bathing Bathing position   Position: Wheelchair/chair at sink  Bathing parts Body parts bathed by patient: Right arm, Left arm, Chest, Abdomen, Front perineal area, Buttocks, Right upper leg, Right lower leg, Back Body parts bathed by helper: Right lower leg, Back  Bathing assist Assist Level: Touching or steadying assistance(Pt > 75%)      Upper Body Dressing/Undressing Upper body dressing   What is the patient wearing?: Pull over shirt/dress     Pull over shirt/dress - Perfomed by patient: Thread/unthread right sleeve, Thread/unthread left sleeve, Put head through opening Pull over shirt/dress - Perfomed by helper: Put head through opening        Upper body assist Assist Level: Touching or steadying assistance(Pt > 75%)      Lower Body Dressing/Undressing Lower body dressing   What is the patient wearing?: Underwear Underwear - Performed by patient: Thread/unthread right underwear leg, Pull underwear up/down Underwear - Performed by helper: Thread/unthread left underwear leg Pants- Performed by patient: Thread/unthread right pants leg, Pull pants up/down Pants- Performed by helper: Thread/unthread left pants leg Non-skid slipper socks- Performed by patient: Don/doff right sock Non-skid slipper socks- Performed by helper: Don/doff left sock                  Lower body assist Assist for lower body dressing:  (mod a)      Toileting Toileting Toileting activity did not  occur: No continent bowel/bladder event Toileting steps completed by patient: Adjust clothing prior to toileting, Performs perineal hygiene, Adjust clothing after toileting Toileting steps completed by helper: Adjust clothing after toileting Toileting Assistive Devices: Grab bar or rail  Toileting assist Assist level: Supervision or verbal cues    Transfers Chair/bed transfer   Chair/bed transfer method: Stand pivot, Ambulatory Chair/bed transfer assist level: Touching or steadying assistance (Pt > 75%) Chair/bed transfer assistive device: Armrests, Orthosis     Locomotion Ambulation     Max distance: 150 Assist level: Touching or steadying assistance (Pt > 75%)   Wheelchair   Type: Manual Max wheelchair distance: 150 Assist Level: Moderate assistance (Pt 50 - 74%)  Cognition Comprehension Comprehension assist level: Understands basic 50 - 74% of the time/ requires cueing 25 - 49% of the time  Expression Expression assist level: Expresses basic 50 - 74% of the time/requires cueing 25 - 49% of the time. Needs to repeat parts of sentences.  Social Interaction Social Interaction assist level: Interacts appropriately 25 - 49% of time - Needs frequent redirection.  Problem Solving Problem solving assist level: Solves basic 25 - 49% of the time - needs direction more than half the time to initiate, plan or complete simple activities  Memory Memory assist level: Recognizes or recalls less than 25% of the time/requires cueing greater than 75% of the time    Medical Problem List and Plan: 1.  Decreased functional mobility secondary to cognitive deficits to TBI/traumatic SDH, left fibular fracture-weightbearing as tolerated with Bledsoe brace  Cont CIR therapies  -PT, OT, SLP 2.  DVT Prophylaxis/Anticoagulation: SCDs. Monitor for any signs of DVT 3. Pain Management: Neurontin decreased to 900 mg TID, Mobic 15 mg daily, Trazodone 300 mg daily at bedtime Oxycodone as needed, Flexeril and Zanaflex as needed  Topamax 25 daily started 3/4 for headaches with some benefit (will stop per #4) 4. Mood:    -becoming more agitated  -continue klonopin  -enclosure bed required for safety  -introduce depakote for mood stabilization, 250mg  BID. Will also use this for headache rx/ stop topamax  -increased incontinence--check ua/ucx also 5.  Neuropsych: This patient is capable of making decisions on her own behalf. 6. Skin/Wound Care: Routine skin checks 7. Fluids/Electrolytes/Nutrition: poor intake  -re-check lytes today 8. Legally blind. Patient can see centrally only 9. Hypertension. Norvasc 10 mg daily, HCTZ 25 mg daily  Controlled 3/5 10. History of multiple sclerosis. Continue Betaseron as directed 11. Constipation. Laxative assistance 12. AKI  Cr. 1.37 on 3/2--- check labs today  Encourage fluids  LOS (Days) 7 A FACE TO FACE EVALUATION WAS PERFORMED  Dublin Cantero T 01/27/2017 9:21 AM

## 2017-01-27 NOTE — Progress Notes (Signed)
Occupational Therapy Note  Patient Details  Name: [name] MRN: [id number] Date of Birth: Jan 16, 1966  Today's Date: 01/27/2017 OT Individual Time: 1400-1430 OT Individual Time Calculation (min): 30 min   Pt denies pain Individual Therapy  Initial focus on orientation.  Pt requires max verbal cues for orientation to place, situation, and date.  Pt continues to exhibit tangential conversation with max verbal cues for redirection.  Pt also engaged in static standing activities with focus on increased activity tolerance and attention to task.  Pt returned to gerichair at nursing station with QRB in place.    Lavone Neri [facility] 01/27/2017, 2:44 PM

## 2017-01-27 NOTE — Progress Notes (Signed)
Occupational Therapy Session Note  Patient Details  Name: [name] MRN: [id number] Date of Birth: [birth year]  Today's Date: 01/27/2017 OT Individual Time: 1130-1200 OT Individual Time Calculation (min): 30 min    Short Term Goals: Week 1:  OT Short Term Goal 1 (Week 1): Pt will don pants using reacher and Min A OT Short Term Goal 2 (Week 1): Pt will tolerate 5 minutes standing at the sink in preparation for ADL tasks OT Short Term Goal 3 (Week 1): Pt will sequence 3 step ADL with min verbal cues  Skilled Therapeutic Interventions/Progress Updates: Therapeutic activity to address cognition- particularly sustain attention. Pt given coins to sort (4 different kinds). Pt able to identify coins tactilly  (as she did PTA  due to decr vision) with mod A. Task downgraded to only two kinds and able to do it without verbal assistance. Pt demonstrated sustained to selective attention for ~10-15 min with min cuing. Pt with decr self distracting behaviors. Pt unable to successful add value of two coins together. Left with NT for lunch.   Second session:114:15-14:45 Pt with c/o headache and fatigue. Attempted to participate in functional cognitive tasks however pt declined and reported her pain. Requested meds from RN. Pt taken to the room and transferred back into enclosure bed with min A for transfer and management of right LE into bed.   Therapy Documentation Precautions:  Precautions Precautions: Fall Precaution Comments: Pt is visually impaired at baseline Required Braces or Orthoses: Other Brace/Splint Other Brace/Splint: Hinged knee brace- unrestricted AROM at L knee Restrictions Weight Bearing Restrictions: Yes LLE Weight Bearing: Weight bearing as tolerated Other Position/Activity Restrictions: Hinged Knee Brace   Pain: Pain Assessment Pain Score: 0-No pain  See Function Navigator for Current Functional Status.   Therapy/Group: Individual Therapy  Roney Mans  [facility] 01/27/2017, 1:38 PM

## 2017-01-27 NOTE — Progress Notes (Signed)
Occupational Therapy Weekly Progress Note  Patient Details  Name: [name] MRN: [id number] Date of Birth: August 20, 1966  Beginning of progress report period: January 21, 2017 End of progress report period: January 27, 2017   Patient has met 1 of 3 short term goals. Pt has made minimal progress with BADLs since admission.  Pt continues to require max/tot A for orientation and redirection to tasks.  Pt's conversation is tangential.  Pt requires max/tot A for safety awareness.  Pt requires mod/max verbal cues for sequencing with bathing/dressing tasks.  Pt exhibits behaviors consistent with [place].  Patient continues to demonstrate the following deficits: muscle weakness, decreased cardiorespiratoy endurance, decreased attention, decreased awareness, decreased problem solving, decreased safety awareness and decreased memory and decreased standing balance, decreased balance strategies and difficulty maintaining precautions and therefore will continue to benefit from skilled OT intervention to enhance overall performance with BADL, iADL and Reduce care partner burden.  Patient progressing toward long term goals..  Continue plan of care.  OT Short Term Goals Week 1:  OT Short Term Goal 1 (Week 1): Pt will don pants using reacher and Min A OT Short Term Goal 1 - Progress (Week 1): Progressing toward goal OT Short Term Goal 2 (Week 1): Pt will tolerate 5 minutes standing at the sink in preparation for ADL tasks OT Short Term Goal 2 - Progress (Week 1): Met OT Short Term Goal 3 (Week 1): Pt will sequence 3 step ADL with min verbal cues OT Short Term Goal 3 - Progress (Week 1): Progressing toward goal Week 2:  OT Short Term Goal 1 (Week 2): Pt will don pants using reacher and Min A OT Short Term Goal 2 (Week 2): Pt will sequence 3 step ADL with min verbal cues OT Short Term Goal 3 (Week 2): Pt will be demonstrate intellectual awareness of deficits with mod verbal cues OT Short Term Goal 4 (Week 2):  Pt will be oriented to place (hospital in [place]) with mod verbal cues   Therapy Documentation Precautions:  Precautions Precautions: Fall Precaution Comments: Pt is visually impaired at baseline Required Braces or Orthoses: Other Brace/Splint Other Brace/Splint: Hinged knee brace- unrestricted AROM at L knee Restrictions Weight Bearing Restrictions: Yes LLE Weight Bearing: Weight bearing as tolerated Other Position/Activity Restrictions: Hinged Knee Brace  See Function Navigator for Current Functional Status.   Leotis Shames [facility] 01/27/2017, 2:51 PM

## 2017-01-28 ENCOUNTER — Inpatient Hospital Stay (HOSPITAL_COMMUNITY): Payer: Medicaid Other | Admitting: Speech Pathology

## 2017-01-28 DIAGNOSIS — S82832A Other fracture of upper and lower end of left fibula, initial encounter for closed fracture: Secondary | ICD-10-CM

## 2017-01-28 NOTE — Progress Notes (Signed)
[place] PHYSICAL MEDICINE & REHABILITATION     PROGRESS NOTE  Subjective/Complaints:  Patient asking whether she still needs the [facility] bed. Patient states she is going home on 3/20, social work note reviewed, no 24 7 supervision available at home  ROS: Limited due cognitive/behavioral    Objective: Vital Signs: Blood pressure 105/77, pulse 93, temperature 98.9 F (37.2 C), temperature source Oral, resp. rate 18, height 5' (1.524 m), weight 68.8 kg (151 lb 9.6 oz), SpO2 99 %. No results found.  Recent Labs  01/27/17 1032  WBC 6.9  HGB 10.9*  HCT 33.3*  PLT 267    Recent Labs  01/27/17 1032  NA 139  K 3.5  CL 102  GLUCOSE 112*  BUN 18  CREATININE 0.79  CALCIUM 10.0   CBG (last 3)  No results for input(s): GLUCAP in the last 72 hours.  Wt Readings from Last 3 Encounters:  01/20/17 68.8 kg (151 lb 9.6 oz)  01/16/17 63.8 kg (140 lb 10.5 oz)    Physical Exam:  BP 105/77 (BP Location: Left Arm)   Pulse 93   Temp 98.9 F (37.2 C) (Oral)   Resp 18   Ht 5' (1.524 m)   Wt 68.8 kg (151 lb 9.6 oz)   LMP  (LMP Unknown)   SpO2 99%   BMI 29.61 kg/m  Constitutional: She appears well-developed and well-nourished.  HENT:  Face healing Eyes: EOM are normal. anicteric.    Cardiovascular: RRR. Respiratory: CTA B. Normal effort  GI: Soft. Bowel sounds are normal.  Musculoskeletal: No edema. Tenderness LLE Neurological: A&O to person. Seems more distracted and restless today. Remains confused. Motor: 5/5 bilateral deltoid, bicep, tricep, grip, right hip flexion, knee extension, ankle dorsiflexion, plantarflexion.--no change RLE: HF, KE 4-/5, 5/5 ADF/PF LLE: HF, NE 3/5, ADF/PF 3+/5---exam stable  Skin: Skin is warm and dry.   Assessment/Plan: 1. Functional deficits secondary to cognitive deficits to TBI/traumatic SDH, left fibular fracture which require 3+ hours per day of interdisciplinary therapy in a comprehensive inpatient rehab setting. Physiatrist is providing  close team supervision and 24 hour management of active medical problems listed below. Physiatrist and rehab team continue to assess barriers to discharge/monitor patient progress toward functional and medical goals.  Function:  Bathing Bathing position   Position: Sitting EOB  Bathing parts Body parts bathed by patient: Right arm, Left arm, Chest, Abdomen, Front perineal area, Buttocks, Right upper leg, Left upper leg Body parts bathed by helper: Right lower leg, Back  Bathing assist Assist Level: Touching or steadying assistance(Pt > 75%)      Upper Body Dressing/Undressing Upper body dressing   What is the patient wearing?: Hospital gown     Pull over shirt/dress - Perfomed by patient: Thread/unthread left sleeve, Thread/unthread right sleeve Pull over shirt/dress - Perfomed by helper: Put head through opening        Upper body assist Assist Level: Touching or steadying assistance(Pt > 75%)      Lower Body Dressing/Undressing Lower body dressing   What is the patient wearing?: Underwear Underwear - Performed by patient: Thread/unthread right underwear leg, Pull underwear up/down Underwear - Performed by helper: Thread/unthread left underwear leg Pants- Performed by patient: Thread/unthread right pants leg, Pull pants up/down Pants- Performed by helper: Thread/unthread left pants leg Non-skid slipper socks- Performed by patient: Don/doff right sock Non-skid slipper socks- Performed by helper: Don/doff left sock                  Lower  body assist Assist for lower body dressing:  (mod a)      Toileting Toileting Toileting activity did not occur: No continent bowel/bladder event Toileting steps completed by patient: Performs perineal hygiene Toileting steps completed by helper: Adjust clothing prior to toileting, Adjust clothing after toileting Toileting Assistive Devices: Grab bar or rail  Toileting assist Assist level: Touching or steadying assistance (Pt.75%)    Transfers Chair/bed transfer   Chair/bed transfer method: Stand pivot, Ambulatory Chair/bed transfer assist level: Touching or steadying assistance (Pt > 75%) Chair/bed transfer assistive device: Armrests, Orthosis     Locomotion Ambulation     Max distance: 150 Assist level: Touching or steadying assistance (Pt > 75%)   Wheelchair   Type: Manual Max wheelchair distance: 150 Assist Level: Moderate assistance (Pt 50 - 74%)  Cognition Comprehension Comprehension assist level: Understands basic 50 - 74% of the time/ requires cueing 25 - 49% of the time  Expression Expression assist level: Expresses basic 75 - 89% of the time/requires cueing 10 - 24% of the time. Needs helper to occlude trach/needs to repeat words.  Social Interaction Social Interaction assist level: Interacts appropriately 25 - 49% of time - Needs frequent redirection.  Problem Solving Problem solving assist level: Solves basic 25 - 49% of the time - needs direction more than half the time to initiate, plan or complete simple activities  Memory Memory assist level: Recognizes or recalls less than 25% of the time/requires cueing greater than 75% of the time    Medical Problem List and Plan: 1.  Decreased functional mobility secondary to cognitive deficits to TBI/traumatic SDH, left fibular fracture-weightbearing as tolerated with Bledsoe brace  Cont CIR therapies, social work may be doing Financial controller  for nursing home bed  -PT, OT, SLP 2.  DVT Prophylaxis/Anticoagulation: SCDs. Monitor for any signs of DVT 3. Pain Management: Neurontin decreased to 900 mg TID, Mobic 15 mg daily, Trazodone 300 mg daily at bedtime Oxycodone as needed, Flexeril and Zanaflex as needed  Topamax 25 daily started 3/4 for headaches with some benefit (will stop per #4) 4. Mood:    -becoming more agitated  -continue klonopin  -enclosure bed required for safety  -introduce depakote for mood stabilization, 250mg  BID. Will also use this for headache  rx/ stop topamax  -increased incontinence--check ua/ucx also 5. Neuropsych: This patient is capable of making decisions on her own behalf. 6. Skin/Wound Care: Routine skin checks 7. Fluids/Electrolytes/Nutrition: poor intake  -re-check lytes today 8. Legally blind. Patient can see centrally only 9. Hypertension. Norvasc 10 mg daily, HCTZ 25 mg daily  Controlled 3/5 10. History of multiple sclerosis. Continue Betaseron as directed 11. Constipation. Laxative assistance 12. AKI  Cr. 1.37 on 3/2--- check labs today  Encourage fluids  LOS (Days) 8 A FACE TO FACE EVALUATION WAS PERFORMED  Mathieu Schloemer E 01/28/2017 11:08 AM

## 2017-01-28 NOTE — Progress Notes (Signed)
Speech Language Pathology Daily Session Note  Patient Details  Name: DEAHNA MINOTTI MRN: [id number] Date of Birth: [birth year]  Today's Date: 01/28/2017 SLP Individual Time: 8502-7741 SLP Individual Time Calculation (min): 30 min  Short Term Goals: Week 2: SLP Short Term Goal 1 (Week 2): Patient will demonstrate sustained attention to tasks for ~5 minutes with Max A verbal cues for redirection.  SLP Short Term Goal 2 (Week 2): Patient will orient to place and situation with Max A multimodal cues.  SLP Short Term Goal 3 (Week 2): Patient will demonstrate intellectual awareness of physical and cognitive deficits with Max A multimodal cues. SLP Short Term Goal 4 (Week 2): Patient will demonstrate functional problem solving with Mod A multimodal cues with self-care tasks.   Skilled Therapeutic Interventions: Skilled treatment session focused cognition goals. SLP facilitated session by providing Max A multimodal cues for redirection to current topic and orientation to place and situation. Pt with intermittent reception and intermittent agitation to attempts to redirect. Pt required Total A for intellectual awareness to physical and cognitive deficits. Pt left in enclosure bed with all needs within reach. Continue per current plan of care.      Function:   Cognition Comprehension Comprehension assist level: Understands basic 50 - 74% of the time/ requires cueing 25 - 49% of the time  Expression   Expression assist level: Expresses basic 75 - 89% of the time/requires cueing 10 - 24% of the time. Needs helper to occlude trach/needs to repeat words.  Social Interaction Social Interaction assist level: Interacts appropriately 25 - 49% of time - Needs frequent redirection.  Problem Solving Problem solving assist level: Solves basic 25 - 49% of the time - needs direction more than half the time to initiate, plan or complete simple activities  Memory Memory assist level: Recognizes or recalls less than 25%  of the time/requires cueing greater than 75% of the time    Pain    Therapy/Group: Individual Therapy   Giovanni Biby B. Dreama Saa, M.S., CCC-SLP Speech-Language Pathologist   Alvetta Hidrogo 01/28/2017, 8:07 AM

## 2017-01-29 ENCOUNTER — Inpatient Hospital Stay (HOSPITAL_COMMUNITY): Payer: Medicaid Other

## 2017-01-29 ENCOUNTER — Inpatient Hospital Stay (HOSPITAL_COMMUNITY): Payer: Medicaid Other | Admitting: Physical Therapy

## 2017-01-29 LAB — URINE CULTURE
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
## 2017-01-29 NOTE — Progress Notes (Signed)
Physical Therapy Weekly Progress Note  Patient Details  Name: [name] MRN: [id number] Date of Birth: 1966/03/22  Beginning of progress report period: January 21, 2017 End of progress report period: January 30, 2017   Patient has met 1 of 3 short term goals.  Pt is making slow progress towards LTG's. Pt continues to be limited by impaired cognition (attention, awareness, recall/memory, orientation, problem solving), LLE pain, and impaired balance. Pt continues to require min assist for ambulation 2/2 balance and vision deficits. Pt also requires max cuing for orientation and awareness with poor carryover. Pt would benefit from continued skilled PT treatment to focus on balance, transfers, ambulation, cognitive remediation, and pt education to increase her safety with all functional mobility.    Patient continues to demonstrate the following deficits muscle weakness, decreased cardiorespiratory endurance, impaired vision (baseline), decreased attention, decreased awareness, decreased problem solving, decreased safety awareness and decreased memory, and decreased standing balance, decreased postural control and decreased balance strategies and therefore will continue to benefit from skilled PT intervention to increase functional independence with mobility.  Patient progressing toward long term goals..  Continue plan of care.  PT Short Term Goals Week 1:  PT Short Term Goal 1 (Week 1): pt will attend to functional task in distracting environment x10 minutes with min verbal cues.   PT Short Term Goal 1 - Progress (Week 1): Progressing toward goal PT Short Term Goal 2 (Week 1): Pt will demonstrate recall of previous PT session with mod question cues PT Short Term Goal 2 - Progress (Week 1): Progressing toward goal PT Short Term Goal 3 (Week 1): Pt will demonstrate dynamic standing balance in unfamiliar environment with min assist.  PT Short Term Goal 3 - Progress (Week 1): Met Week 2:  PT Short Term  Goal 1 (Week 2): STG = LTG due to estimated d/c date.   Therapy Documentation Precautions:  Precautions Precautions: Fall Precaution Comments: Pt is visually impaired at baseline Required Braces or Orthoses: Other Brace/Splint Other Brace/Splint: Hinged knee brace- unrestricted AROM at L knee Restrictions Weight Bearing Restrictions: Yes LLE Weight Bearing: Weight bearing as tolerated Other Position/Activity Restrictions: Hinged Knee Brace   See Function Navigator for Current Functional Status.   Waunita Schooner 01/30/2017, 7:56 AM
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
## 2017-01-30 ENCOUNTER — Encounter (HOSPITAL_COMMUNITY): Payer: Medicaid Other | Admitting: Psychology

## 2017-01-30 ENCOUNTER — Inpatient Hospital Stay (HOSPITAL_COMMUNITY): Payer: Medicaid Other | Admitting: Physical Therapy

## 2017-01-30 ENCOUNTER — Inpatient Hospital Stay (HOSPITAL_COMMUNITY): Payer: Medicaid Other | Admitting: Occupational Therapy

## 2017-01-30 ENCOUNTER — Inpatient Hospital Stay (HOSPITAL_COMMUNITY): Payer: Medicaid Other | Admitting: Speech Pathology

## 2017-01-30 DIAGNOSIS — N390 Urinary tract infection, site not specified: Secondary | ICD-10-CM

## 2017-01-30 DIAGNOSIS — S069X3A Unspecified intracranial injury with loss of consciousness of 1 hour to 5 hours 59 minutes, initial encounter: Secondary | ICD-10-CM

## 2017-01-30 DIAGNOSIS — A499 Bacterial infection, unspecified: Secondary | ICD-10-CM

## 2017-01-30 MED ORDER — CEPHALEXIN 250 MG PO CAPS
250.0000 mg | ORAL_CAPSULE | Freq: Three times a day (TID) | ORAL | Status: AC
  Administered 2017-01-30 – 2017-02-01 (×9): 250 mg via ORAL
  Filled 2017-01-30 (×9): qty 1
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
## 2017-01-30 NOTE — Progress Notes (Signed)
Speech Language Pathology Daily Session Note  Patient Details  Name: [name] MRN: [id number] Date of Birth: 1966-10-14  Today's Date: 01/30/2017 SLP Individual Time: 1400-1500 SLP Individual Time Calculation (min): 60 min  Short Term Goals: Week 2: SLP Short Term Goal 1 (Week 2): Patient will demonstrate sustained attention to tasks for ~5 minutes with Max A verbal cues for redirection.  SLP Short Term Goal 2 (Week 2): Patient will orient to place and situation with Max A multimodal cues.  SLP Short Term Goal 3 (Week 2): Patient will demonstrate intellectual awareness of physical and cognitive deficits with Max A multimodal cues. SLP Short Term Goal 4 (Week 2): Patient will demonstrate functional problem solving with Mod A multimodal cues with self-care tasks.   Skilled Therapeutic Interventions: Skilled treatment session focused on cognitive goals. SLP facilitated session by providing total A for problem solving in regards to using room phone to make personal calls. Patient agreeable to get out of bed and transferred with supervision. Patient participated in a verbal description task and maintained topic of conversation and sustained attention to task for ~30 minutes with supervision verbal cues for redirection. Patient was independently oriented to the hospital and demonstrated decreased confabulation, language of confusion and perseveration with increased ability to be redirected. Patient left upright in wheelchair with quick release belt in place. Continue with current plan of care.      Function:   Cognition Comprehension Comprehension assist level: Understands basic 75 - 89% of the time/ requires cueing 10 - 24% of the time  Expression   Expression assist level: Expresses basic 75 - 89% of the time/requires cueing 10 - 24% of the time. Needs helper to occlude trach/needs to repeat words.  Social Interaction Social Interaction assist level: Interacts appropriately 50 - 74% of the  time - May be physically or verbally inappropriate.  Problem Solving Problem solving assist level: Solves basic 25 - 49% of the time - needs direction more than half the time to initiate, plan or complete simple activities  Memory Memory assist level: Recognizes or recalls 50 - 74% of the time/requires cueing 25 - 49% of the time    Pain No/Denies Pain   Therapy/Group: Individual Therapy  [name] 01/30/2017, 3:07 PM

## 2017-01-30 NOTE — Progress Notes (Signed)
Pt refusing to reapply Bledsoe brace to leg. Pt removed while in bed. RN educated pt on purpose and importance of brace. Pt states, "it hurts and uncomfortable. I am going to make a new one."  RN will continue to educate importance of brace.
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
## 2017-01-30 NOTE — Progress Notes (Signed)
Physical Therapy Session Note  Patient Details  Name: SHALETTA CORDERO MRN: [id number] Date of Birth: [birth year]  Today's Date: 01/30/2017 PT Individual Time: 1500-1530 PT Individual Time Calculation (min): 30 min   Short Term Goals: Week 2:  PT Short Term Goal 1 (Week 2): STG = LTG due to estimated d/c date.   Skilled Therapeutic Interventions/Progress Updates:    no c/o pain.  Session focus on orientation, attention, education, and functional mobility. Pt perseverative throughout session on missing clothing and needing to buy some clothes from the "dining room."    Pt had removed Bledsoe brace stating it was uncomfortable.  Brace was in pt's bed with all padding removed and tangled in sheets.  Reapplied brace with focus on pt orientation to situation and need for brace.  Ambulation throughout room with L HHA to look for pt's clothes and pt sat EOB for upper body dressing (set up assist).  Gait training x60' in hallway with HHA needed for visual deficits.  Pt returned to room at end of session and positioned in w/c with QRB in place.  Chair alarm unable to be activated, RN aware.   Therapy Documentation Precautions:  Precautions Precautions: Fall Precaution Comments: Pt is visually impaired at baseline Required Braces or Orthoses: Other Brace/Splint Other Brace/Splint: Hinged knee brace- unrestricted AROM at L knee Restrictions Weight Bearing Restrictions: Yes LLE Weight Bearing: Weight bearing as tolerated Other Position/Activity Restrictions: Hinged Knee Brace   See Function Navigator for Current Functional Status.   Therapy/Group: Individual Therapy  Ladora Daniel Penven-Crew 01/30/2017, 3:51 PM
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
## 2017-01-30 NOTE — Progress Notes (Signed)
Occupational Therapy Session Note  Patient Details  Name: [name] MRN: [id number] Date of Birth: [birth year]  Today's Date: 01/30/2017 OT Individual Time:  - 1104-11-09   (75 min)      Short Term Goals: Week 1:  OT Short Term Goal 1 (Week 1): Pt will don pants using reacher and Min A OT Short Term Goal 1 - Progress (Week 1): Progressing toward goal OT Short Term Goal 2 (Week 1): Pt will tolerate 5 minutes standing at the sink in preparation for ADL tasks OT Short Term Goal 2 - Progress (Week 1): Met OT Short Term Goal 3 (Week 1): Pt will sequence 3 step ADL with min verbal cues OT Short Term Goal 3 - Progress (Week 1): Progressing toward goal Week 2:  OT Short Term Goal 1 (Week 2): Pt will don pants using reacher and Min A OT Short Term Goal 2 (Week 2): Pt will sequence 3 step ADL with min verbal cues OT Short Term Goal 3 (Week 2): Pt will be demonstrate intellectual awareness of deficits with mod verbal cues OT Short Term Goal 4 (Week 2): Pt will be oriented to place (hospital in [place]) with mod verbal cues  Skilled Therapeutic Interventions/Progress Updates:    PPt sitting in wc up on OT arrival.  Pt  Oriented to time, place, and situation.  Pt demonstrated short attention span with pt demonstrating  Topic variations.  Pt performed bathing at sink and donned hospital gown.  PPt perseverated on not having clothes and wanting OT to take her shopping.  OT explained hospital policy.  PPt called bank and was able to give account number with no assistance.  Pt found out the balance in her account.  Took pt to supply closet on 4 MW to find mesh panties.  Pt donned panties in room with cues to put left leg first.  Pt. Left in room taking to DAd over phone with QRB on.   Therapy Documentation Precautions:  Precautions Precautions: Fall Precaution Comments: Pt is visually impaired at baseline Required Braces or Orthoses: Other Brace/Splint Other Brace/Splint: Hinged knee brace-  unrestricted AROM at L knee Restrictions Weight Bearing Restrictions: Yes LLE Weight Bearing: Weight bearing as tolerated Other Position/Activity Restrictions: Hinged Knee Brace       Pain: Pain Assessment Pain Assessment: No/denies pain ADL: ADL ADL Comments: Please see functional navigator Exercises:   Other Treatments:    See Function Navigator for Current Functional Status.   Therapy/Group: Individual Therapy  Lisa Roca 01/30/2017, 11:49 AM

## 2017-01-30 NOTE — Progress Notes (Signed)
Social Work Patient ID: [name], female   DOB: 28-Sep-1966, 51 y.o.   MRN: [id number]  Pt sitting in w/c and appearing calm.  Attempted to discuss with her her d/c plans as team recommending she have 24/7 assistance.  Discussed option of SNF placement and she immediately refuses this as she explains, "I like my freedom...sometimes I want to go outside and work in the garden."  Attempted several times to explain  the reasons for 24/7 recommendation.  She begins to offer family members who might be able to assist, however, each time she concludes that "no...that won't work..." for each option.  Pt required some redirection to stay on topic and agreed that she would speak with "[name]" tonight and we will talk again tomorrow.  Majesty Oehlert, LCSW

## 2017-01-30 NOTE — Plan of Care (Signed)
Problem: RH SAFETY Goal: RH STG ADHERE TO SAFETY PRECAUTIONS W/ASSISTANCE/DEVICE STG Adhere to Safety Precautions With supervision/cues Assistance/Device.   Outcome: Not Progressing enclosure bed used at this time.

## 2017-01-31 ENCOUNTER — Inpatient Hospital Stay (HOSPITAL_COMMUNITY): Payer: Medicaid Other | Admitting: Physical Therapy

## 2017-01-31 ENCOUNTER — Inpatient Hospital Stay (HOSPITAL_COMMUNITY): Payer: Medicaid Other | Admitting: Speech Pathology

## 2017-01-31 ENCOUNTER — Inpatient Hospital Stay (HOSPITAL_COMMUNITY): Payer: Medicaid Other | Admitting: Occupational Therapy

## 2017-01-31 NOTE — Progress Notes (Signed)
Speech Language Pathology Daily Session Note  Patient Details  Name: [name] MRN: [id number] Date of Birth: [birth year]  Today's Date: 01/31/2017 SLP Individual Time: 5110-2111 SLP Individual Time Calculation (min): 60 min  Short Term Goals: Week 2: SLP Short Term Goal 1 (Week 2): Patient will demonstrate sustained attention to tasks for ~5 minutes with Max A verbal cues for redirection.  SLP Short Term Goal 2 (Week 2): Patient will orient to place and situation with Max A multimodal cues.  SLP Short Term Goal 3 (Week 2): Patient will demonstrate intellectual awareness of physical and cognitive deficits with Max A multimodal cues. SLP Short Term Goal 4 (Week 2): Patient will demonstrate functional problem solving with Mod A multimodal cues with self-care tasks.   Skilled Therapeutic Interventions: Skilled treatment session focused on cognitive goals. Patient was independently oriented to place, time and situation and identified physical and cognitive deficits with supervision verbal cues. Patient participated in a task of interest for ~30 minutes with supervision verbal cues for redirection. Patient maintained topic of conversation with Min A verbal cues and was easily redirected throughout session. Patient left upright in wheelchair with all needs within reach and quick release belt in place. Continue with current plan of care.      Function:    Comprehension Comprehension assist level: Understands basic 75 - 89% of the time/ requires cueing 10 - 24% of the time  Expression   Expression assist level: Expresses basic 75 - 89% of the time/requires cueing 10 - 24% of the time. Needs helper to occlude trach/needs to repeat words.  Social Interaction Social Interaction assist level: Interacts appropriately 50 - 74% of the time - May be physically or verbally inappropriate.  Problem Solving Problem solving assist level: Solves basic 50 - 74% of the time/requires cueing 25 - 49% of the time   Memory Memory assist level: Recognizes or recalls 50 - 74% of the time/requires cueing 25 - 49% of the time    Pain No/Denies Pain   Therapy/Group: Individual Therapy  Elisabet Gutzmer 01/31/2017, 3:24 PM
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
## 2017-01-31 NOTE — Progress Notes (Signed)
Pt educated on importance of SCDs with pt refusing. Pt has refused SCDs throughout rehab stay. No other VTE prophylaxis in place. MD, Riley Kill notified with no new orders at this time.

## 2017-01-31 NOTE — Progress Notes (Signed)
Social Work Patient ID: [name], female   DOB: Oct 13, 1966, 51 y.o.   MRN: [id number]   Spoke with pt again this morning and reviewed discussion of SNF.  Pt, again, reports that she doesn't like the idea, however, she is aware that I am going to being bed search process.  She will continue to speak with family but understands that, unless she is able to secure 24/7 caregiver, she will need to d/c to SNF.  Tonya Yusupov, LCSW
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
## 2017-01-31 NOTE — Progress Notes (Signed)
Occupational Therapy Session Note  Patient Details  Name: [name] MRN: [id number] Date of Birth: 1966-06-08  Today's Date: 01/31/2017 OT Individual Time: 1330-1430 OT Individual Time Calculation (min): 60 min    Short Term Goals: Week 2:  OT Short Term Goal 1 (Week 2): Pt will don pants using reacher and Min A OT Short Term Goal 2 (Week 2): Pt will sequence 3 step ADL with min verbal cues OT Short Term Goal 3 (Week 2): Pt will be demonstrate intellectual awareness of deficits with mod verbal cues OT Short Term Goal 4 (Week 2): Pt will be oriented to place (hospital in [place]) with mod verbal cues  Skilled Therapeutic Interventions/Progress Updates: 1:1 Pt trying to get into contact with her lawyer when arrived. Pt oriented to place, situation, month and self. Pt declined showering this pm due to fatigue. Pt with much improve awareness and ability to carry on conversation today compared to last week. Pt discussing d/c plans and her goals for herself longterm. Pt engaged in dressing with min A and A to don bledsoe brace. Brace missing some of the padding at the knee portion- made new pad for brace. Pt engaged in brushing teeth at sink. Pt did require setup - pt reported brushing her teeth with lotion this am and not wanting to do that again. Pt left with nursing in the room.      Therapy Documentation Precautions:  Precautions Precautions: Fall Precaution Comments: Pt is visually impaired at baseline Required Braces or Orthoses: Other Brace/Splint Other Brace/Splint: Hinged knee brace- unrestricted AROM at L knee Restrictions Weight Bearing Restrictions: Yes LLE Weight Bearing: Weight bearing as tolerated Other Position/Activity Restrictions: Hinged Knee Brace  Pain:  no c/o pain in session  See Function Navigator for Current Functional Status.   Therapy/Group: Individual Therapy  Roney Mans [facility] 01/31/2017, 6:54 PM

## 2017-02-01 ENCOUNTER — Inpatient Hospital Stay (HOSPITAL_COMMUNITY): Payer: Medicaid Other | Admitting: Physical Therapy

## 2017-02-01 ENCOUNTER — Inpatient Hospital Stay (HOSPITAL_COMMUNITY): Payer: Medicaid Other

## 2017-02-01 ENCOUNTER — Inpatient Hospital Stay (HOSPITAL_COMMUNITY): Payer: Medicaid Other | Admitting: Speech Pathology

## 2017-02-01 ENCOUNTER — Inpatient Hospital Stay (HOSPITAL_COMMUNITY): Payer: Medicaid Other | Admitting: Occupational Therapy

## 2017-02-01 NOTE — Progress Notes (Signed)
Speech Language Pathology Daily Session Note  Patient Details  Name: [name] MRN: [id number] Date of Birth: 01/12/1966  Today's Date: 02/01/2017 SLP Individual Time: 0905-0950 SLP Individual Time Calculation (min): 45 min  Short Term Goals: Week 2: SLP Short Term Goal 1 (Week 2): Patient will demonstrate sustained attention to tasks for ~5 minutes with Max A verbal cues for redirection.  SLP Short Term Goal 2 (Week 2): Patient will orient to place and situation with Max A multimodal cues.  SLP Short Term Goal 3 (Week 2): Patient will demonstrate intellectual awareness of physical and cognitive deficits with Max A multimodal cues. SLP Short Term Goal 4 (Week 2): Patient will demonstrate functional problem solving with Mod A multimodal cues with self-care tasks.   Skilled Therapeutic Interventions: Skilled treatment session focused on cognitive goals. SLP facilitated session by providing Min A verbal cues for recall of events from previous therapy session. Patient perseverative on going to the gift shop but was easily redirected. Patient asked clinician to help organize her room to improve her ability to find personal items due to visual impairments. Patient verbally problem solved where to place items to maximize function and recall with Min A verbal cues. Patient also participated in a recall task and required Max A verbal cues for use of memory compensatory strategies and completed task with 75% accuracy. Patient left upright in chair with quick release belt in place. Continue with current plan of care.      Function:    Cognition Comprehension Comprehension assist level: Understands basic 75 - 89% of the time/ requires cueing 10 - 24% of the time  Expression   Expression assist level: Expresses basic 75 - 89% of the time/requires cueing 10 - 24% of the time. Needs helper to occlude trach/needs to repeat words.  Social Interaction Social Interaction assist level: Interacts appropriately  50 - 74% of the time - May be physically or verbally inappropriate.  Problem Solving Problem solving assist level: Solves basic 50 - 74% of the time/requires cueing 25 - 49% of the time  Memory Memory assist level: Recognizes or recalls 50 - 74% of the time/requires cueing 25 - 49% of the time    Pain No reports of pain   Therapy/Group: Individual Therapy  Melana Hingle 02/01/2017, 12:43 PM

## 2017-02-01 NOTE — Progress Notes (Signed)
[place] PHYSICAL MEDICINE & REHABILITATION     PROGRESS NOTE  Subjective/Complaints:  Slept well out of [place] bed. Staff reports improved cognition    Objective: Vital Signs: Blood pressure 96/66, pulse 91, temperature 98.8 F (37.1 C), temperature source Oral, resp. rate 20, height 5' (1.524 m), weight 66.9 kg (147 lb 7.8 oz), SpO2 100 %. No results found. No results for input(s): WBC, HGB, HCT, PLT in the last 72 hours. No results for input(s): NA, K, CL, GLUCOSE, BUN, CREATININE, CALCIUM in the last 72 hours.  Invalid input(s): CO CBG (last 3)  No results for input(s): GLUCAP in the last 72 hours.  Wt Readings from Last 3 Encounters:  02/01/17 66.9 kg (147 lb 7.8 oz)  01/16/17 63.8 kg (140 lb 10.5 oz)    Physical Exam:  BP 96/66 (BP Location: Left Arm)   Pulse 91   Temp 98.8 F (37.1 C) (Oral)   Resp 20   Ht 5' (1.524 m)   Wt 66.9 kg (147 lb 7.8 oz)   LMP  (LMP Unknown)   SpO2 100%   BMI 28.80 kg/m  Constitutional: She appears well-developed and well-nourished.  HENT:  atraumatic Eyes: EOM are normal. anicteric.    Cardiovascular: RRR Respiratory:   CTA B GI: Soft. Bowel sounds are normal.  Musculoskeletal: No edema. Left knee out of KI today. No pain Neurological: Oriented to hospital, name, month,year--missed date by one. No confabulation or delusions. Motor: 5/5 bilateral deltoid, bicep, tricep, grip, right hip flexion, knee extension, ankle dorsiflexion, plantarflexion.--motor exam stable RLE: HF, KE 4-/5, 5/5 ADF/PF LLE: HF, NE 3/5, ADF/PF 3+/5-- .  Skin: Skin is warm and dry.   Assessment/Plan: 1. Functional deficits secondary to cognitive deficits to TBI/traumatic SDH, left fibular fracture which require 3+ hours per day of interdisciplinary therapy in a comprehensive inpatient rehab setting. Physiatrist is providing close team supervision and 24 hour management of active medical problems listed below. Physiatrist and rehab team continue to assess  barriers to discharge/monitor patient progress toward functional and medical goals.  Function:  Bathing Bathing position   Position: Wheelchair/chair at sink  Bathing parts Body parts bathed by patient: Right arm, Left arm, Chest, Abdomen, Front perineal area, Buttocks, Right upper leg, Left upper leg Body parts bathed by helper: Right lower leg, Back  Bathing assist Assist Level: Touching or steadying assistance(Pt > 75%)      Upper Body Dressing/Undressing Upper body dressing   What is the patient wearing?: Bra, Pull over shirt/dress Bra - Perfomed by patient: Thread/unthread right bra strap, Thread/unthread left bra strap, Hook/unhook bra (pull down sports bra)   Pull over shirt/dress - Perfomed by patient: Thread/unthread left sleeve, Thread/unthread right sleeve, Put head through opening, Pull shirt over trunk Pull over shirt/dress - Perfomed by helper: Put head through opening        Upper body assist Assist Level: Set up      Lower Body Dressing/Undressing Lower body dressing   What is the patient wearing?: AFO, Pants Underwear - Performed by patient: Thread/unthread right underwear leg, Thread/unthread left underwear leg, Pull underwear up/down Underwear - Performed by helper: Thread/unthread left underwear leg Pants- Performed by patient: Thread/unthread right pants leg, Pull pants up/down, Thread/unthread left pants leg Pants- Performed by helper: Thread/unthread left pants leg Non-skid slipper socks- Performed by patient: Don/doff right sock Non-skid slipper socks- Performed by helper: Don/doff left sock     Shoes - Performed by patient: Don/doff right shoe, Fasten right Shoes - Performed by  helper: Don/doff left shoe, Fasten left   AFO - Performed by helper:  (orthosis/ brace for LE)      Lower body assist Assist for lower body dressing: Supervision or verbal cues      Toileting Toileting Toileting activity did not occur: No continent bowel/bladder  event Toileting steps completed by patient: Adjust clothing prior to toileting, Performs perineal hygiene, Adjust clothing after toileting Toileting steps completed by helper: Adjust clothing prior to toileting, Adjust clothing after toileting Toileting Assistive Devices: Grab bar or rail  Toileting assist Assist level: Touching or steadying assistance (Pt.75%)   Transfers Chair/bed transfer   Chair/bed transfer method: Stand pivot Chair/bed transfer assist level: Supervision or verbal cues Chair/bed transfer assistive device: Armrests, Orthosis     Locomotion Ambulation     Max distance: 120 ft Assist level: Supervision or verbal cues   Wheelchair   Type: Manual Max wheelchair distance: 150 Assist Level: Moderate assistance (Pt 50 - 74%)  Cognition Comprehension Comprehension assist level: Understands basic 75 - 89% of the time/ requires cueing 10 - 24% of the time  Expression Expression assist level: Expresses basic 75 - 89% of the time/requires cueing 10 - 24% of the time. Needs helper to occlude trach/needs to repeat words.  Social Interaction Social Interaction assist level: Interacts appropriately 50 - 74% of the time - May be physically or verbally inappropriate.  Problem Solving Problem solving assist level: Solves basic 50 - 74% of the time/requires cueing 25 - 49% of the time  Memory Memory assist level: Recognizes or recalls 50 - 74% of the time/requires cueing 25 - 49% of the time    Medical Problem List and Plan: 1.  Decreased functional mobility secondary to cognitive deficits to TBI/traumatic SDH, left fibular fracture-weightbearing as tolerated with Bledsoe brace  -continue CIR therapies: PT, OT, SLP  -working toward placement now  -cognition improved substantially over the last 24 hours 2.  DVT Prophylaxis/Anticoagulation: SCDs. Monitor for any signs of DVT 3. Pain Management: Neurontin decreased to 900 mg TID, Mobic 15 mg daily, Trazodone 300 mg daily at bedtime  Oxycodone as needed, Flexeril and Zanaflex as needed   -depakote for h/a 4. Mood:    -less agitation as a whole  -continue klonopin  -removed enclosure bed and no problems last noc  -continue depakote for mood stabilization, 250mg  BID.   5. Neuropsych: This patient is NOT capable of making decisions on her own behalf. 6. Skin/Wound Care: Routine skin checks 7. Fluids/Electrolytes/Nutrition: poor intake  -re-check lytes stable on Friday 8. Legally blind. Patient can see centrally only 9. Hypertension. Norvasc 10 mg daily, HCTZ 25 mg daily  Controlled 3/11 Vitals:   01/31/17 1500 02/01/17 0500  BP: 117/88 96/66  Pulse: (!) 111 91  Resp: 20 20  Temp: 99.6 F (37.6 C) 98.8 F (37.1 C)   10. History of multiple sclerosis. Continue Betaseron as directed 11. Constipation. Laxative assistance 12. AKI- Resolved   BMP Latest Ref Rng & Units 01/27/2017 01/23/2017 01/20/2017  Glucose 65 - 99 mg/dL 960(A) 540(J) 811(B)  BUN 6 - 20 mg/dL 18 14 10   Creatinine 0.44 - 1.00 mg/dL 1.47 8.29 5.62(Z)  Sodium 135 - 145 mmol/L 139 138 142  Potassium 3.5 - 5.1 mmol/L 3.5 3.6 3.5  Chloride 101 - 111 mmol/L 102 103 103  CO2 22 - 32 mmol/L 30 29 27   Calcium 8.9 - 10.3 mg/dL 30.8 9.3 9.2    Encourage fluids 13. Bladder: 30k proteus---keflex complete today  LOS (Days) 12 A FACE TO FACE EVALUATION WAS PERFORMED  SWARTZ,ZACHARY T 02/01/2017 9:27 AM

## 2017-02-01 NOTE — Progress Notes (Signed)
Physical Therapy Session Note  Patient Details  Name: [name] MRN: [id number] Date of Birth: 1966-04-15  Today's Date: 02/01/2017 PT Individual Time: 7829-5621 PT Individual Time Calculation (min): 30 min   Short Term Goals: Week 2:  PT Short Term Goal 1 (Week 2): STG = LTG due to estimated d/c date.   Skilled Therapeutic Interventions/Progress Updates:  Pt received in w/c attempting to move flower vase across room, therapist provided assistance. Transported pt to gym via w/c total assist for time management. Pt engaged in dribbling basketball x 1 minute with steady/min assist for standing balance before reporting inability to complete activity 2/2 back soreness, rest break provided. Pt then performed BLE standing hip/knee flexion and seated hamstring curls with theraband for resistance. Pt requires max multimodal cuing for proper technique for exercises but reports increased pain in LLE with seated hamstring curls. Pt oriented to location & reason for hospitalization but is unable to correlate her L LE fx with the pain & weakness she is experiencing in limb, and becomes agitated when therapist attempts to provided education. At end of session pt left sitting in w/c in room with QRB & chair alarm donned, all needs within reach, & [employer] monitor present.   Therapy Documentation Precautions:  Precautions Precautions: Fall Precaution Comments: Pt is visually impaired at baseline Required Braces or Orthoses: Other Brace/Splint Other Brace/Splint: Hinged knee brace- unrestricted AROM at L knee Restrictions Weight Bearing Restrictions: Yes LLE Weight Bearing: Weight bearing as tolerated Other Position/Activity Restrictions: Hinged Knee Brace   See Function Navigator for Current Functional Status.   Therapy/Group: Individual Therapy  Sandi Mariscal 02/01/2017, 11:42 AM

## 2017-02-01 NOTE — Progress Notes (Signed)
Occupational Therapy Session Note  Patient Details  Name: [name] MRN: [id number] Date of Birth: 29-Mar-1966  Today's Date: 02/01/2017 OT Individual Time: 1005-1030 OT Individual Time Calculation (min): 25 min    Short Term Goals: Week 2:  OT Short Term Goal 1 (Week 2): Pt will don pants using reacher and Min A OT Short Term Goal 2 (Week 2): Pt will sequence 3 step ADL with min verbal cues OT Short Term Goal 3 (Week 2): Pt will be demonstrate intellectual awareness of deficits with mod verbal cues OT Short Term Goal 4 (Week 2): Pt will be oriented to place (hospital in [place]) with mod verbal cues  Skilled Therapeutic Interventions/Progress Updates:    1:1 OT session focused on problem solving, low vision techniques, and standing tolerance. Pt greeted seated in wc with nurse present. Sit<>stand with supervision, tolerated standing for 5 minute intervals while playing checkers on raised table. Utilized contrasting colors for low vision with checkers game. Verbal cues for problem solving. Min cues to remain on task in minimally distracting environment. Returned pt to room and left seated in w/c.  Therapy Documentation Precautions:  Precautions Precautions: Fall Precaution Comments: Pt is visually impaired at baseline Required Braces or Orthoses: Other Brace/Splint Other Brace/Splint: Hinged knee brace- unrestricted AROM at L knee Restrictions Weight Bearing Restrictions: Yes LLE Weight Bearing: Weight bearing as tolerated Other Position/Activity Restrictions: Hinged Knee Brace Pain: Pain Assessment Pain Assessment: No/denies pain ADL: ADL ADL Comments: Please see functional navigator  See Function Navigator for Current Functional Status.   Therapy/Group: Individual Therapy  [name] 02/01/2017, 10:23 AM

## 2017-02-01 NOTE — Discharge Summary (Signed)
Discharge summary job # [id number]

## 2017-02-01 NOTE — Progress Notes (Signed)
Occupational Therapy Session Note  Patient Details  Name: [name] MRN: [id number] Date of Birth: [birth year]  Today's Date: 02/01/2017 OT Individual Time: 0800-0900 OT Individual Time Calculation (min): 60 min    Short Term Goals: Week 2:  OT Short Term Goal 1 (Week 2): Pt will don pants using reacher and Min A OT Short Term Goal 2 (Week 2): Pt will sequence 3 step ADL with min verbal cues OT Short Term Goal 3 (Week 2): Pt will be demonstrate intellectual awareness of deficits with mod verbal cues OT Short Term Goal 4 (Week 2): Pt will be oriented to place (hospital in [place]) with mod verbal cues  Skilled Therapeutic Interventions/Progress Updates:    Pt seated EOB upon arrival with RN present.  Assisted pt with donning of LLE Bledsoe and amb with HHA to bathroom to use toilet and don clean shirt and bra.  Pt completed toileting tasks at close supervision level.  Pt oriented to place, situation, and date.  Pt transitioned to therapy gym and engaged in dynamic standing activities with focus on w/c setup, locking brakes, and activity tolerance,  L leg rest replaced on w/c.  Pt returned to room and remained in w/c with alarm activated, QRB in place, and all needs within reach.  Focus on functional amb, standing balance, and safety awareness to increase independence with BADLs.   Therapy Documentation Precautions:  Precautions Precautions: Fall Precaution Comments: Pt is visually impaired at baseline Required Braces or Orthoses: Other Brace/Splint Other Brace/Splint: Hinged knee brace- unrestricted AROM at L knee Restrictions Weight Bearing Restrictions: Yes LLE Weight Bearing: Weight bearing as tolerated Other Position/Activity Restrictions: Hinged Knee Brace Pain:  Pt denied pain  See Function Navigator for Current Functional Status.   Therapy/Group: Individual Therapy  Rich Brave 02/01/2017, 9:03 AM

## 2017-02-01 NOTE — Progress Notes (Signed)
Social Work Patient ID: [name], female   DOB: January 18, 1966, 51 y.o.   MRN: [id number]   Have received 3 SNF bed offers today and have presented choices to pt.  She feels she is being pushed to make a quick decision, however, I have explained that we must plan to d/c her tomorrow.  She wants to speak with her family about the choices and will give me her decision in the morning.  I have also reviewed this information with her sister, [name], who understands and will speak with pt this evening. Team should plan for tomorrow d/c.  Acelynn Dejonge, LCSW

## 2017-02-01 NOTE — Progress Notes (Signed)
Occupational Therapy Note  Patient Details  Name: [name] MRN: [id number] Date of Birth: [birth year]  Today's Date: 02/01/2017 OT Individual Time: 1345-1430 OT Individual Time Calculation (min): 45 min   Pt denied pain Individual therapy  Pt resting in w/c upon arrival with RN present.  Assisted with donning LLE Bledsoe prior to transitioning to [employer].  Engaged in standing activity with [employer].  Pt required min verbal cues for locating lights.  Pt stood at board with supervision while engaging in [employer] task.  Pt also engaged in standing task at Engineer, structural.  Pt required min verbal cues locating tokens on checker board.  Pt returned to room and remained in w/c, Bledsoe donned, chair alarm activated, QRB in place, and all needs within reach. Focus on standing balance and safety awareness.    Lavone Neri [facility] 02/01/2017, 2:34 PM

## 2017-02-02 ENCOUNTER — Inpatient Hospital Stay (HOSPITAL_COMMUNITY): Payer: Medicaid Other | Admitting: Speech Pathology

## 2017-02-02 ENCOUNTER — Encounter: Payer: Self-pay | Admitting: Emergency Medicine

## 2017-02-02 ENCOUNTER — Inpatient Hospital Stay (HOSPITAL_COMMUNITY): Payer: Medicaid Other | Admitting: Physical Therapy

## 2017-02-02 ENCOUNTER — Inpatient Hospital Stay (HOSPITAL_COMMUNITY): Payer: Medicaid Other | Admitting: Occupational Therapy

## 2017-02-02 MED ORDER — DIVALPROEX SODIUM 250 MG PO DR TAB: 250.0000 mg | DELAYED_RELEASE_TABLET | Freq: Two times a day (BID) | ORAL | 0 refills | Status: DC

## 2017-02-02 MED ORDER — TRAZODONE HCL 300 MG PO TABS: 300.0000 mg | ORAL_TABLET | Freq: Every day | ORAL | 0 refills | Status: DC

## 2017-02-02 MED ORDER — CLONAZEPAM 0.5 MG PO TABS: 0.5000 mg | ORAL_TABLET | Freq: Two times a day (BID) | ORAL | 0 refills | Status: DC

## 2017-02-02 MED ORDER — TIZANIDINE HCL 4 MG PO TABS
4.0000 mg | ORAL_TABLET | Freq: Four times a day (QID) | ORAL | 0 refills | Status: DC | PRN
Start: 2017-02-02 — End: 2018-05-17

## 2017-02-02 MED ORDER — OXYCODONE HCL 5 MG PO TABS: 5.0000 mg | ORAL_TABLET | ORAL | 0 refills | Status: DC | PRN

## 2017-02-02 NOTE — Plan of Care (Signed)
Problem: RH Ambulation Goal: LTG Patient will ambulate in controlled environment (PT) LTG: Patient will ambulate in a controlled environment, # of feet with assistance (PT).  Outcome: Not Met (add Reason) Pt to D/c to Snf at min assist level.  Goal: LTG Patient will ambulate in home environment (PT) LTG: Patient will ambulate in home environment, # of feet with assistance (PT).  Outcome: Not Met (add Reason)  Pt to D/c to SNF at min assist level  Goal: LTG Patient will ambulate in community environment (PT) LTG: Patient will ambulate in community environment, # of feet with assistance (PT).  Outcome: Not Met (add Reason)  Pt to D/c to SNF at min assist level

## 2017-02-02 NOTE — Plan of Care (Signed)
Problem: RH KNOWLEDGE DEFICIT BRAIN INJURY Goal: RH STG INCREASE KNOWLEDGE OF SELF CARE AFTER BRAIN INJURY Mod I assist  Outcome: Not Progressing Difficult for pt to accept knowledge of self care at this time.

## 2017-02-02 NOTE — Progress Notes (Signed)
Physical Therapy Session Note  Patient Details  Name: [name] MRN: [id number] Date of Birth: 29-Jun-1966  Today's Date: 02/01/2017 PT Individual Time:1605-1700   PT Individual Time Calculation: 76mn  Short Term Goals: Week 1:  PT Short Term Goal 1 (Week 1): pt will attend to functional task in distracting environment x10 minutes with min verbal cues.   PT Short Term Goal 1 - Progress (Week 1): Progressing toward goal PT Short Term Goal 2 (Week 1): Pt will demonstrate recall of previous PT session with mod question cues PT Short Term Goal 2 - Progress (Week 1): Progressing toward goal PT Short Term Goal 3 (Week 1): Pt will demonstrate dynamic standing balance in unfamiliar environment with min assist.  PT Short Term Goal 3 - Progress (Week 1): Met Week 2:  PT Short Term Goal 1 (Week 2): STG = LTG due to estimated d/c date.   Skilled Therapeutic Interventions/Progress Updates:   Pt received sitting in WC and agreeable to PT. PT assisted patient to don Bledso brace.   PT instructed patient in gait training for 2063fx 2 with min assist from PT to prevent Lateral LOB with min cues for awareness of surroundings due to visual impairments. Stair training completed x 8 with min assist from PT and moderate cues for step to gait pattern to reduce stress on LLE. Gait training also performed over various surfaces including ramp and compliant surface with min assist from PT for safety.   Car transfer completed with min assist for safety with min cues for improved technique to reduce stress on LLE and prevent excessive time in SLS. Pt noted to only slightly adjust technique due to impulsivity.   PT instructed patient in bed mobility to bed with supervision assist in training apartment with min cues for safety and awareness of location in bed.   Throughout treatment, pt perseverating on possible d/c tomorrow and continues to report for preferance to continue to do therapy here while living at home.  PT explained to pt that once she is d/c'ed from Rehab, she cannot continue therapy here and explained benefits of continued therapy at SNF. Pt verbalized understanding to this therapist.   [facility] and all needs met.        Therapy Documentation Precautions:  Precautions Precautions: Fall Precaution Comments: Pt is visually impaired at baseline Required Braces or Orthoses: Other Brace/Splint Other Brace/Splint: Hinged knee brace- unrestricted AROM at L knee Restrictions Weight Bearing Restrictions: Yes LLE Weight Bearing: Weight bearing as tolerated Other Position/Activity Restrictions: Hinged Knee Brace    Pain: 0/10   See Function Navigator for Current Functional Status.   Therapy/Group: Individual Therapy  AuLorie Phenix/15/2018, 5:19 AM

## 2017-02-02 NOTE — Progress Notes (Signed)
Anxious at HS about going to new facility. "I need to talk to the Doctor in the morning." Emotional support provided. [name] A
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
## 2017-02-02 NOTE — Progress Notes (Signed)
Occupational Therapy Discharge Summary  Patient Details  Name: [name] MRN: [id number] Date of Birth: 06-04-1966  Today's Date: 02/02/2017 OT Individual Time: 1030-1100 OT Individual Time Calculation (min): 30 min   Patient has met 10 of 10 long term goals due to improved activity tolerance, improved balance, ability to compensate for deficits and improved attention.  Patient to discharge at overall Supervision level.  Patient's care partner unavailable to provide the necessary cognitive assistance at discharge. Patient to discharge to SNF for continued therapy.   Reasons goals not met: n/a  Recommendation:  Patient will benefit from ongoing skilled OT services in skilled nursing facility setting to continue to advance functional skills in the area of BADL and iADL.  Equipment: No equipment provided  Reasons for discharge: treatment goals met and discharge from hospital  Patient/family agrees with progress made and goals achieved: Yes  1:1 OT session focused on LB LB dressing modifications and low vision techniques for donning doffing L hinged Knee brace.   OT Discharge Precautions/Restrictions  Precautions Precautions: Fall Precaution Comments: Pt is visually impaired at baseline Required Braces or Orthoses: Other Brace/Splint Other Brace/Splint: Hinged knee brace- unrestricted AROM at L knee Restrictions Weight Bearing Restrictions: Yes LLE Weight Bearing: Weight bearing as tolerated Other Position/Activity Restrictions: Hinged Knee Brace Pain  denies pain ADL ADL Eating: Supervision/safety Grooming: Supervision/safety Upper Body Bathing: Supervision/safety Lower Body Bathing: Supervision/safety Upper Body Dressing: Supervision/safety Lower Body Dressing: Supervision/safety Toileting: Supervision/safety Toilet Transfer: Close supervision ADL Comments: Please see functional navigator Vision/Perception    legally blind at baseline Cognition Overall Cognitive  Status: Impaired/Different from baseline Arousal/Alertness: Awake/alert Orientation Level: Oriented X4 Attention: Sustained Focused Attention: Appears intact Sustained Attention: Appears intact Memory: Impaired Memory Impairment: Decreased recall of new information Awareness: Impaired Problem Solving: Impaired Behaviors: Poor frustration tolerance;Perseveration;Impulsive Rancho BuildDNA.es Scales of Cognitive Functioning: Confused/appropriate Sensation Sensation Light Touch: Appears Intact Coordination Gross Motor Movements are Fluid and Coordinated: No Fine Motor Movements are Fluid and Coordinated: No Motor  Motor Motor: Abnormal postural alignment and control Motor - Discharge Observations: mild postual deficits and decreased coordination on the LLE Mobility  Transfers Sit to Stand: 5: Supervision Sit to Stand Details: Verbal cues for precautions/safety Stand to Sit: 5: Supervision Stand to Sit Details (indicate cue type and reason): Verbal cues for precautions/safety  Trunk/Postural Assessment  Cervical Assessment Cervical Assessment: Within Functional Limits Thoracic Assessment Thoracic Assessment: Within Functional Limits Lumbar Assessment Lumbar Assessment: Within Functional Limits Postural Control Postural Control: Within Functional Limits  Balance Balance Balance Assessed: Yes Dynamic Standing Balance Dynamic Standing - Balance Support: During functional activity;Left upper extremity supported Dynamic Standing - Level of Assistance: 5: Stand by assistance Dynamic Standing - Balance Activities: Reaching for objects Extremity/Trunk Assessment RUE Assessment RUE Assessment: Within Functional Limits LUE Assessment LUE Assessment: Within Functional Limits   See Function Navigator for Current Functional Status.  [name] [name] 02/02/2017, 8:50 PM

## 2017-02-02 NOTE — Progress Notes (Signed)
Physical Therapy Discharge Summary  Patient Details  Name: [name] MRN: [id number] Date of Birth: 09/29/1966  Today's Date: 02/02/2017 PT Individual Time: 1100-1200 PT Individual Time Calculation (min): 60 min    Patient has met 4 of 9 long term goals due to improved activity tolerance, improved balance, improved postural control, increased strength, increased range of motion, decreased pain, improved attention and improved awareness.  Patient to discharge at an ambulatory level [place].   Patient's care partner is independent to provide the necessary physical and cognitive assistance at discharge.  Reasons goals not met: Pt d/c to SNF at min assist level, prior to progressing to supervision assist  Recommendation:  Patient will benefit from ongoing skilled PT services in skilled nursing facility setting to continue to advance safe functional mobility, address ongoing impairments in balance, awareness, safety, transfers, ambulation, and minimize fall risk.  Equipment: No equipment provided  Reasons for discharge: discharge from hospital  Patient/family agrees with progress made and goals achieved: Yes   PT treatment:  PT instructed patient in grad day assessment to measure progress toward goals. Pt continues to remain at min assist level for dynamic movement due to visual deificits and poor processing skills. . Following grad days assessment, pt retured to room with call bell in reach and all needs met.   PT Discharge Precautions/Restrictions Restrictions Weight Bearing Restrictions: Yes LLE Weight Bearing: Weight bearing as tolerated Vital Signs   Pain Pain Assessment Pain Assessment: No/denies pain Pain Score: 0-No pain Vision/Perception     Cognition Overall Cognitive Status: Impaired/Different from baseline Orientation Level: Oriented X4 Awareness: Impaired Problem Solving: Impaired Safety/Judgment: Impaired Comments: pt with mild awareness of and safety  deficits Sensation Sensation Light Touch: Appears Intact Additional Comments: pt able to identify all stimuli to BLE. Coordination Gross Motor Movements are Fluid and Coordinated: No Coordination and Movement Description: mild coordination deficits on the LLE with inconsistent step length Motor  Motor Motor: Abnormal postural alignment and control Motor - Discharge Observations: mild postual deficits and decreased coordination on the LLE  Mobility Bed Mobility Bed Mobility: Rolling Right;Supine to Sit;Rolling Left;Sit to Supine Rolling Right: 6: Modified independent (Device/Increase time) Rolling Left: 6: Modified independent (Device/Increase time) Supine to Sit: 6: Modified independent (Device/Increase time) Sitting - Scoot to Edge of Bed: 6: Modified independent (Device/Increase time) Sit to Supine: 6: Modified independent (Device/Increase time) Transfers Transfers: Yes Sit to Stand: 5: Supervision Sit to Stand Details: Verbal cues for precautions/safety Stand to Sit: 5: Supervision Stand to Sit Details (indicate cue type and reason): Verbal cues for precautions/safety Stand Pivot Transfers: 5: Supervision Stand Pivot Transfer Details: Verbal cues for precautions/safety Locomotion  Ambulation Ambulation: Yes Ambulation/Gait Assistance: 4: Min assist Ambulation Distance (Feet): 150 Feet Assistive device: None Ambulation/Gait Assistance Details: Tactile cues for weight shifting;Verbal cues for precautions/safety Gait Gait: Yes Gait Pattern: Impaired Gait Pattern: Abducted- right;Right circumduction Stairs / Additional Locomotion Stairs: Yes Stairs Assistance: 5: Supervision Stair Management Technique: Two rails Number of Stairs: 12 Height of Stairs: 6 (and 3) Ramp: 4: Min assist Curb: 4: Min Chemical engineer: Yes Wheelchair Assistance: 5: Investment banker, operational Details: Verbal cues for Engineer, maintenance: Both upper extremities  Trunk/Postural Assessment  Cervical Assessment Cervical Assessment: Within Scientist, physiological Assessment: Within Functional Limits Lumbar Assessment Lumbar Assessment: Within Functional Limits Postural Control Postural Control: Within Functional Limits  Balance Balance Balance Assessed: Yes Standardized Balance Assessment Standardized Balance Assessment: [facility] Balance Test [facility] Balance Test Sit  to Stand: Able to stand without using hands and stabilize independently Standing Unsupported: Able to stand safely 2 minutes Sitting with Back Unsupported but Feet Supported on Floor or Stool: Able to sit safely and securely 2 minutes Stand to Sit: Sits safely with minimal use of hands Transfers: Able to transfer safely, definite need of hands (2/2 vision & balance impairments) Standing Unsupported with Eyes Closed: Able to stand 10 seconds safely Standing Ubsupported with Feet Together: Able to place feet together independently and stand 1 minute safely From Standing, Reach Forward with Outstretched Arm: Can reach forward >12 cm safely (5") From Standing Position, Pick up Object from Floor: Able to pick up shoe, needs supervision From Standing Position, Turn to Look Behind Over each Shoulder: Turn sideways only but maintains balance Turn 360 Degrees: Able to turn 360 degrees safely one side only in 4 seconds or less (able to complete turns to both directions with close supervision and extra time) Standing Unsupported, Alternately Place Feet on Step/Stool: Able to complete >2 steps/needs minimal assist Standing Unsupported, One Foot in Front: Able to take small step independently and hold 30 seconds Standing on One Leg: Tries to lift leg/unable to hold 3 seconds but remains standing independently Total Score: 42 Static Standing Balance Static Standing - Level of Assistance: 5: Stand by assistance Dynamic Standing Balance Dynamic  Standing - Level of Assistance: 4: Min assist Extremity Assessment      RLE Assessment RLE Assessment: Within Functional Limits LLE Assessment LLE Assessment: Exceptions to [facility] LLE Strength LLE Overall Strength Comments: 4+/5 throughout LLE proximal to distal.    See Function Navigator for Current Functional Status.  Lorie Phenix 02/02/2017, 11:59 AM

## 2017-02-02 NOTE — Progress Notes (Signed)
Rec'd call from staffing; informed that needs tele-sitter d/c.  D/C order as requested so monitoring can be stopped.

## 2017-02-02 NOTE — Progress Notes (Signed)
Speech Language Pathology Discharge Summary  Patient Details  Name: [name] MRN: [id number] Date of Birth: [birth year]  Today's Date: 02/02/2017 SLP Individual Time: 4656-8127 SLP Individual Time Calculation (min): 45 min   Skilled Therapeutic Interventions:  Skilled treatment session focused on cognitive goals. SLP facilitated session by re-administering the MoCA-BLIND. Patient scored 13/22 points with a score of 18 or above considered normal. Patient demonstrates impairments in the areas of short-term recall, attention, and language. Patient educated on deficits and verbalized understanding. Patient left upright in wheelchair with all needs within reach. Continue with current plan of care.      Patient has met 1 of 4 long term goals.  Patient to discharge at overall Max level.   Reasons goals not met: Goals not met due to short length of stay due to SNF placement    Clinical Impression/Discharge Summary:   Patient has made minimal gains and has met 1 of 4 LTG's this admission due to improved attention. Goals were not met due to a short length of stay due to SNF placement. Currently, patient demonstrates behaviors consistent with a Rancho Level VI and requires overall Max A to complete functional and familiar tasks safely in regards to problem solving, recall and awareness. Patient's family is unable to provide the necessary assistance needed at this time and patient will discharge to a SNF. Patient would benefit from f/u SLP services to maximize her cognitive function and overall functional independence.   Care Partner:  Caregiver Able to Provide Assistance: No  Type of Caregiver Assistance: Physical;Cognitive  Recommendation:  Skilled Nursing facility  Rationale for SLP Follow Up: Maximize cognitive function and independence;Maximize functional communication;Reduce caregiver burden   Equipment: N/A   Reasons for discharge: Discharged from hospital   Patient/Family Agrees with  Progress Made and Goals Achieved: Yes   Function:    Cognition Comprehension Comprehension assist level: Understands basic 75 - 89% of the time/ requires cueing 10 - 24% of the time  Expression   Expression assist level: Expresses basic 75 - 89% of the time/requires cueing 10 - 24% of the time. Needs helper to occlude trach/needs to repeat words.  Social Interaction Social Interaction assist level: Interacts appropriately 50 - 74% of the time - May be physically or verbally inappropriate.  Problem Solving Problem solving assist level: Solves basic 25 - 49% of the time - needs direction more than half the time to initiate, plan or complete simple activities  Memory Memory assist level: Recognizes or recalls 25 - 49% of the time/requires cueing 50 - 75% of the time   Tamari Redwine 02/02/2017, 4:06 PM

## 2017-02-02 NOTE — Progress Notes (Signed)
[facility]     PROGRESS NOTE  Subjective/Complaints:  Up at EOB. Upset about going to a different facility. Started talking about a conversation she had with her pastor last night.   ROS: pt denies nausea, vomiting, diarrhea, cough, shortness of breath or chest pain     Objective: Vital Signs: Blood pressure 109/78, pulse 77, temperature 98.9 F (37.2 C), temperature source Oral, resp. rate 18, height 5' (1.524 m), weight 66.9 kg (147 lb 7.8 oz), SpO2 100 %. No results found. No results for input(s): WBC, HGB, HCT, PLT in the last 72 hours. No results for input(s): NA, K, CL, GLUCOSE, BUN, CREATININE, CALCIUM in the last 72 hours.  Invalid input(s): CO CBG (last 3)  No results for input(s): GLUCAP in the last 72 hours.  Wt Readings from Last 3 Encounters:  02/01/17 66.9 kg (147 lb 7.8 oz)  01/16/17 63.8 kg (140 lb 10.5 oz)    Physical Exam:  BP 109/78 (BP Location: Left Arm)   Pulse 77   Temp 98.9 F (37.2 C) (Oral)   Resp 18   Ht 5' (1.524 m)   Wt 66.9 kg (147 lb 7.8 oz)   LMP  (LMP Unknown)   SpO2 100%   BMI 28.80 kg/m  Constitutional: She appears well-developed and well-nourished.  HENT:  atraumatic Eyes: EOM are normal. anicteric.    Cardiovascular: RRR Respiratory:   CTA B GI: Soft. Bowel sounds are normal.  Musculoskeletal: No edema. Left knee out of KI today. No pain Neurological: Oriented to hospital, name, month,year--missed date by one. No confabulation or delusions. Motor: 5/5 bilateral deltoid, bicep, tricep, grip, right hip flexion, knee extension, ankle dorsiflexion, plantarflexion.--motor exam stable RLE: HF, KE 4-/5, 5/5 ADF/PF LLE: HF, NE 3/5, ADF/PF 3+/5-- .  Skin: Skin is warm and dry.  Psych: more anxious Assessment/Plan: 1. Functional deficits secondary to cognitive deficits to TBI/traumatic SDH, left fibular fracture which require 3+ hours per day of interdisciplinary therapy in a comprehensive inpatient  rehab setting. Physiatrist is providing close team supervision and 24 hour management of active medical problems listed below. Physiatrist and rehab team continue to assess barriers to discharge/monitor patient progress toward functional and medical goals.  Function:  Bathing Bathing position   Position: Wheelchair/chair at sink  Bathing parts Body parts bathed by patient: Right arm, Left arm, Chest, Abdomen, Front perineal area, Buttocks, Right upper leg, Left upper leg Body parts bathed by helper: Right lower leg, Back  Bathing assist Assist Level: Touching or steadying assistance(Pt > 75%)      Upper Body Dressing/Undressing Upper body dressing   What is the patient wearing?: Bra, Pull over shirt/dress Bra - Perfomed by patient: Thread/unthread right bra strap, Thread/unthread left bra strap, Hook/unhook bra (pull down sports bra)   Pull over shirt/dress - Perfomed by patient: Thread/unthread left sleeve, Thread/unthread right sleeve, Put head through opening, Pull shirt over trunk Pull over shirt/dress - Perfomed by helper: Put head through opening        Upper body assist Assist Level: Set up      Lower Body Dressing/Undressing Lower body dressing   What is the patient wearing?: AFO, Pants Underwear - Performed by patient: Thread/unthread right underwear leg, Thread/unthread left underwear leg, Pull underwear up/down Underwear - Performed by helper: Thread/unthread left underwear leg Pants- Performed by patient: Thread/unthread right pants leg, Pull pants up/down, Thread/unthread left pants leg Pants- Performed by helper: Thread/unthread left pants leg Non-skid slipper socks- Performed by patient:  Don/doff right sock Non-skid slipper socks- Performed by helper: Don/doff left sock     Shoes - Performed by patient: Don/doff right shoe, Fasten right Shoes - Performed by helper: Don/doff left shoe, Fasten left   AFO - Performed by helper:  (orthosis/ brace for LE)       Lower body assist Assist for lower body dressing: Supervision or verbal cues      Toileting Toileting Toileting activity did not occur: No continent bowel/bladder event Toileting steps completed by patient: Adjust clothing prior to toileting, Performs perineal hygiene, Adjust clothing after toileting Toileting steps completed by helper: Adjust clothing prior to toileting, Adjust clothing after toileting Toileting Assistive Devices: Grab bar or rail  Toileting assist Assist level: Touching or steadying assistance (Pt.75%)   Transfers Chair/bed transfer   Chair/bed transfer method: Stand pivot Chair/bed transfer assist level: Touching or steadying assistance (Pt > 75%) Chair/bed transfer assistive device: Armrests     Locomotion Ambulation     Max distance: 222ft  Assist level: Touching or steadying assistance (Pt > 75%)   Wheelchair   Type: Manual Max wheelchair distance: 150 Assist Level: Moderate assistance (Pt 50 - 74%)  Cognition Comprehension Comprehension assist level: Understands basic 75 - 89% of the time/ requires cueing 10 - 24% of the time  Expression Expression assist level: Expresses basic 75 - 89% of the time/requires cueing 10 - 24% of the time. Needs helper to occlude trach/needs to repeat words.  Social Interaction Social Interaction assist level: Interacts appropriately 50 - 74% of the time - May be physically or verbally inappropriate.  Problem Solving Problem solving assist level: Solves basic 25 - 49% of the time - needs direction more than half the time to initiate, plan or complete simple activities  Memory Memory assist level: Recognizes or recalls 25 - 49% of the time/requires cueing 50 - 75% of the time    Medical Problem List and Plan: 1.  Decreased functional mobility secondary to cognitive deficits to TBI/traumatic SDH, left fibular fracture-weightbearing as tolerated with Bledsoe brace  -to SNF today  -tried to reason with patient today regarding  placement. Between anxiety and cognition it was really difficult to rationalize with her  -follow up in my office over next month 2.  DVT Prophylaxis/Anticoagulation: SCDs. Monitor for any signs of DVT 3. Pain Management: Neurontin decreased to 900 mg TID, Mobic 15 mg daily, Trazodone 300 mg daily at bedtime Oxycodone as needed, Flexeril and Zanaflex as needed   -depakote for h/a 4. Mood:    -less agitation as a whole  -continue klonopin  -   -continue depakote for mood stabilization, 250mg  BID.   5. Neuropsych: This patient is NOT capable of making decisions on her own behalf. 6. Skin/Wound Care: Routine skin checks 7. Fluids/Electrolytes/Nutrition: poor intake  -re-check lytes stable on Friday 8. Legally blind. Patient can see centrally only 9. Hypertension. Norvasc 10 mg daily, HCTZ 25 mg daily  Controlled 3/11 Vitals:   02/01/17 1349 02/02/17 0700  BP: 106/72 109/78  Pulse: 90 77  Resp: 19 18  Temp: 98.8 F (37.1 C) 98.9 F (37.2 C)   10. History of multiple sclerosis. Continue Betaseron as directed 11. Constipation. Laxative assistance 12. AKI- Resolved   BMP Latest Ref Rng & Units 01/27/2017 01/23/2017 01/20/2017  Glucose 65 - 99 mg/dL 395(V) 202(B) 343(H)  BUN 6 - 20 mg/dL 18 14 10   Creatinine 0.44 - 1.00 mg/dL 6.86 1.68 3.72(B)  Sodium 135 - 145 mmol/L 139 138 142  Potassium 3.5 - 5.1 mmol/L 3.5 3.6 3.5  Chloride 101 - 111 mmol/L 102 103 103  CO2 22 - 32 mmol/L 30 29 27   Calcium 8.9 - 10.3 mg/dL 16.1 9.3 9.2    Encourage fluids 13. Bladder: 30k proteus---keflex completed   LOS (Days) 13 A FACE TO FACE EVALUATION WAS PERFORMED  Keelyn Fjelstad T 02/02/2017 9:09 AM

## 2017-02-02 NOTE — Progress Notes (Signed)
Called pt report to [facility] ph: [phone], tt Sheralyn Boatman. Informed that transport scheduled for 1pm today.

## 2017-02-02 NOTE — Progress Notes (Signed)
Social Work  Discharge Note  The overall goal for the admission was met for:   Discharge location: No - upon initial assessment, able to determine that plan would need to be changed to SNF as identified caregiver on pre-admit was not a reliable person nor a person that pt wanted to have involved in her care.  Length of Stay: Yes - actually able to secure d/c to SNF location earlier than originally targeted d/c date.  LOS = 13 days  Discharge activity level: Yes - min assist overall  Home/community participation: Yes  Services provided included: MD, RD, PT, OT, SLP, RN, TR, Pharmacy, Neuropsych and SW  Financial Services: Medicaid  Follow-up services arranged: Other: SNF at [facility]  Comments (or additional information):  Patient/Family verbalized understanding of follow-up arrangements: Yes  Individual responsible for coordination of the follow-up plan: pt/ sister  Confirmed correct DME delivered: NA    Oneka Parada

## 2017-02-02 NOTE — Progress Notes (Signed)
Pt leaving facility via transport to [facility].  Pt has two personal belongings bag.  Brace intact to left lower extremity. Cell phone, charger, clothing, wallet, and pocket book packed up. VS WNL, pt aware that going to facility but unable to recall name of the facility.

## 2017-02-02 NOTE — Progress Notes (Signed)
Picked up pt valuable envelope.  Informed pt that will gv to transport unopened.  Said that needed debit card to make rent.  Pt opened up package and retrieved black wallet. Informed that because package opened she is responsible for items and hospital no longer responsible. Attempted to keep items but pt stated that "my mom said that it's best I keep up with my stuff"

## 2017-02-06 ENCOUNTER — Encounter: Payer: Self-pay | Admitting: Internal Medicine

## 2017-02-06 ENCOUNTER — Non-Acute Institutional Stay (SKILLED_NURSING_FACILITY): Payer: Medicaid Other | Admitting: Internal Medicine

## 2017-02-06 DIAGNOSIS — I609 Nontraumatic subarachnoid hemorrhage, unspecified: Secondary | ICD-10-CM

## 2017-02-06 DIAGNOSIS — I10 Essential (primary) hypertension: Secondary | ICD-10-CM | POA: Diagnosis not present

## 2017-02-06 DIAGNOSIS — F32A Depression, unspecified: Secondary | ICD-10-CM

## 2017-02-06 DIAGNOSIS — K5901 Slow transit constipation: Secondary | ICD-10-CM

## 2017-02-06 DIAGNOSIS — G35 Multiple sclerosis: Secondary | ICD-10-CM | POA: Diagnosis not present

## 2017-02-06 DIAGNOSIS — F411 Generalized anxiety disorder: Secondary | ICD-10-CM | POA: Diagnosis not present

## 2017-02-06 DIAGNOSIS — R5381 Other malaise: Secondary | ICD-10-CM

## 2017-02-06 DIAGNOSIS — K219 Gastro-esophageal reflux disease without esophagitis: Secondary | ICD-10-CM

## 2017-02-06 DIAGNOSIS — D500 Iron deficiency anemia secondary to blood loss (chronic): Secondary | ICD-10-CM | POA: Diagnosis not present

## 2017-02-06 DIAGNOSIS — S82832S Other fracture of upper and lower end of left fibula, sequela: Secondary | ICD-10-CM | POA: Diagnosis not present

## 2017-02-06 DIAGNOSIS — F329 Major depressive disorder, single episode, unspecified: Secondary | ICD-10-CM | POA: Diagnosis not present

## 2017-02-06 DIAGNOSIS — S01511S Laceration without foreign body of lip, sequela: Secondary | ICD-10-CM | POA: Diagnosis not present

## 2017-02-06 DIAGNOSIS — G35D Multiple sclerosis, unspecified: Secondary | ICD-10-CM
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
## 2017-02-07 ENCOUNTER — Non-Acute Institutional Stay (SKILLED_NURSING_FACILITY): Payer: Medicaid Other | Admitting: Family

## 2017-02-07 ENCOUNTER — Encounter: Payer: Self-pay | Admitting: Family

## 2017-02-07 DIAGNOSIS — M792 Neuralgia and neuritis, unspecified: Secondary | ICD-10-CM

## 2017-02-07 DIAGNOSIS — G35 Multiple sclerosis: Secondary | ICD-10-CM | POA: Diagnosis not present

## 2017-02-07 DIAGNOSIS — R2681 Unsteadiness on feet: Secondary | ICD-10-CM | POA: Diagnosis not present

## 2017-02-07 DIAGNOSIS — K5901 Slow transit constipation: Secondary | ICD-10-CM

## 2017-02-07 DIAGNOSIS — S82832D Other fracture of upper and lower end of left fibula, subsequent encounter for closed fracture with routine healing: Secondary | ICD-10-CM

## 2017-02-07 DIAGNOSIS — F411 Generalized anxiety disorder: Secondary | ICD-10-CM | POA: Diagnosis not present
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
## 2017-03-15 ENCOUNTER — Encounter: Payer: Self-pay | Admitting: Physical Medicine & Rehabilitation

## 2017-04-11 ENCOUNTER — Ambulatory Visit: Payer: Medicaid Other | Attending: Family Medicine | Admitting: Physical Therapy

## 2017-04-11 ENCOUNTER — Encounter: Payer: Self-pay | Admitting: Physical Therapy

## 2017-04-11 DIAGNOSIS — R2689 Other abnormalities of gait and mobility: Secondary | ICD-10-CM | POA: Diagnosis present

## 2017-04-11 DIAGNOSIS — S069X3S Unspecified intracranial injury with loss of consciousness of 1 hour to 5 hours 59 minutes, sequela: Secondary | ICD-10-CM

## 2017-04-11 DIAGNOSIS — M25562 Pain in left knee: Secondary | ICD-10-CM | POA: Diagnosis present

## 2017-04-11 DIAGNOSIS — X58XXXS Exposure to other specified factors, sequela: Secondary | ICD-10-CM | POA: Insufficient documentation
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
## 2017-04-24 ENCOUNTER — Ambulatory Visit: Payer: Medicaid Other | Admitting: Physical Therapy

## 2017-05-01 ENCOUNTER — Ambulatory Visit: Payer: Medicaid Other | Attending: Family Medicine | Admitting: Physical Therapy

## 2017-05-01 ENCOUNTER — Encounter: Payer: Self-pay | Admitting: Physical Therapy

## 2017-05-01 DIAGNOSIS — M25562 Pain in left knee: Secondary | ICD-10-CM | POA: Diagnosis present

## 2017-05-01 DIAGNOSIS — R2689 Other abnormalities of gait and mobility: Secondary | ICD-10-CM | POA: Diagnosis present

## 2017-05-01 DIAGNOSIS — M5441 Lumbago with sciatica, right side: Secondary | ICD-10-CM | POA: Insufficient documentation
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
## 2017-05-01 NOTE — Patient Instructions (Addendum)
Heel Slide    Bend knee and pull heel toward buttocks. Hold __1_ seconds. Return. Repeat with other knee. Repeat _10___ times. Do __1__ sessions per day.  http://gt2.exer.us/372   Copyright  VHI. All rights reserved.  Short Arc [employer] a large can or rolled towel under leg. Straighten knee and leg. Hold __1__ seconds. Repeat with other leg. Repeat __10__ times. Do __1__ sessions per day.  http://gt2.exer.us/366   Copyright  VHI. All rights reserved.  Quad Set    With other leg bent, foot flat, slowly tighten muscles on thigh of straight leg while counting out loud to __3__. Repeat with other leg. Repeat _10___ times. Do __1__ sessions per day.  http://[email or web address].exer.us/276   Copyright  VHI. All rights reserved.  Hip Flexion / Knee Extension: Straight-Leg Raise (Eccentric)    Lie on back. Lift leg with knee straight. Slowly lower leg for 3-5 seconds. __10_ reps per set, _1__ sets per day, once per day. Lower like elevator, stopping at each floor.  Copyright  VHI. All rights reserved.  Abduction / Adduction: Controlled Motion (Supine)   Copyright  VHI. All rights reserved.    Sit with feet flat. With band tied around both legs, Lift right leg slightly and, against resistance band, draw it out to side. Complete __2_ sets of __10_ repetitions. Perform _2__ sessions per day.  Copyright  VHI. All rights reserved.  FLEXION: Sitting - Resistance Band (Active)   Sit, both feet flat. Have band tied around both legs above knees, lift right knee toward ceiling.Repeat with other knee Complete _2__ sets of _10__ repetitions. Perform _2__ sessions per day.  http://gtsc.exer.us/21

## 2017-05-03 ENCOUNTER — Encounter: Payer: Self-pay | Admitting: Physical Therapy

## 2017-05-03 ENCOUNTER — Ambulatory Visit: Payer: Medicaid Other | Admitting: Physical Therapy

## 2017-05-03 DIAGNOSIS — M25562 Pain in left knee: Secondary | ICD-10-CM | POA: Diagnosis not present

## 2017-05-03 DIAGNOSIS — R2689 Other abnormalities of gait and mobility: Secondary | ICD-10-CM
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
## 2017-05-03 NOTE — Patient Instructions (Addendum)
Abduction: Clam (Eccentric) - Side-Lying    Lie on side with knees bent. Lift top knee, keeping feet together. Keep trunk steady. Slowly lower for 3-5 seconds. _10_ reps per set, __2_ sets per day, _5__ days per week. Use red resistance band  http://ecce.exer.us/65   Copyright  VHI. All rights reserved.  Bridge    Lie back, legs bent. Inhale, pressing hips up. Keeping ribs in, lengthen lower back. Exhale, rolling down along spine from top. *Keep arms across chest (different from picture) Repeat __10__ times. Do __2__ sets. Do once per day.  http://pm.exer.us/55   Copyright  VHI. All rights reserved.  Knee Extension: Terminal - Standing (Single Leg)    band around knee. Put band through doorway. Allow tension of band to slightly bend knee. Pull leg back, straightening knee. Repeat _10_ times per set. Repeat with other leg. Do _2_ sets per session. Do _5_ sessions per week. Anchor Height: Knee  http://tub.exer.us/36   Copyright  VHI. All rights reserved.

## 2017-05-08 ENCOUNTER — Encounter: Payer: Self-pay | Admitting: Physical Therapy

## 2017-05-08 ENCOUNTER — Ambulatory Visit: Payer: Medicaid Other | Admitting: Physical Therapy

## 2017-05-08 DIAGNOSIS — M25562 Pain in left knee: Secondary | ICD-10-CM | POA: Diagnosis not present

## 2017-05-08 DIAGNOSIS — R2689 Other abnormalities of gait and mobility: Secondary | ICD-10-CM
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
## 2017-05-10 ENCOUNTER — Encounter: Payer: Self-pay | Admitting: Physical Therapy

## 2017-05-10 ENCOUNTER — Ambulatory Visit: Payer: Medicaid Other | Admitting: Physical Therapy

## 2017-05-10 DIAGNOSIS — R2689 Other abnormalities of gait and mobility: Secondary | ICD-10-CM

## 2017-05-10 DIAGNOSIS — M25562 Pain in left knee: Secondary | ICD-10-CM

## 2017-05-10 NOTE — Patient Instructions (Signed)
Balance, Proprioception: Hip Abduction With Tubing   With tubing attached to both ankles, Standing holding onto counter, kick one leg out to side and then Return.  Repeat _10___ times  On each side.  Do ___2_ sessions per day.  [email or web address]   Balance, Proprioception: Hip Extension With Tubing   With tubing tied around both legs, holding onto kitchen counter, swing leg back. Return. Repeat _10___ times . Do __2__ sessions per day.  [email or web address]    Band Walk: Side Stepping   Tie band around legs, AROUND ANKLES. Step _10__ feet to one side, then step back to start. Repeat _2-3__ feet per session. Note: Small towel between band and skin eases rubbing.  [email or web address]
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
## 2017-05-15 ENCOUNTER — Encounter: Payer: Self-pay | Admitting: Physical Therapy

## 2017-05-15 ENCOUNTER — Ambulatory Visit: Payer: Medicaid Other | Admitting: Physical Therapy

## 2017-05-15 DIAGNOSIS — M25562 Pain in left knee: Secondary | ICD-10-CM

## 2017-05-15 DIAGNOSIS — R2689 Other abnormalities of gait and mobility: Secondary | ICD-10-CM
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
## 2017-05-17 ENCOUNTER — Ambulatory Visit: Payer: Medicaid Other | Admitting: Physical Therapy

## 2017-05-17 DIAGNOSIS — M25562 Pain in left knee: Secondary | ICD-10-CM

## 2017-05-17 DIAGNOSIS — R2689 Other abnormalities of gait and mobility: Secondary | ICD-10-CM

## 2017-05-17 NOTE — Patient Instructions (Signed)
   Copyright  VHI. All rights reserved.  ABDUCTION: Sitting - Exercise Ball: Resistance Band (Active)   Sit with feet flat in a regular chair. With band tied around both legs, Lift left leg slightly and, against resistance band, draw it out to side. Complete __2_ sets of __10_ repetitions. Perform _2__ sessions per day.
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
## 2017-05-22 ENCOUNTER — Encounter: Payer: Self-pay | Admitting: Physical Therapy

## 2017-05-22 ENCOUNTER — Ambulatory Visit: Payer: Medicaid Other | Attending: Family Medicine | Admitting: Physical Therapy

## 2017-05-22 DIAGNOSIS — R2689 Other abnormalities of gait and mobility: Secondary | ICD-10-CM

## 2017-05-22 DIAGNOSIS — M25562 Pain in left knee: Secondary | ICD-10-CM | POA: Diagnosis not present
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
## 2017-05-30 ENCOUNTER — Ambulatory Visit: Payer: Medicaid Other | Admitting: Physical Therapy

## 2017-05-30 ENCOUNTER — Encounter: Payer: Self-pay | Admitting: Physical Therapy

## 2017-05-30 DIAGNOSIS — M25562 Pain in left knee: Secondary | ICD-10-CM | POA: Diagnosis not present
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
## 2017-06-01 ENCOUNTER — Ambulatory Visit: Payer: Medicaid Other | Admitting: Physical Therapy

## 2017-06-01 ENCOUNTER — Encounter: Payer: Self-pay | Admitting: Physical Therapy

## 2017-06-01 DIAGNOSIS — R2689 Other abnormalities of gait and mobility: Secondary | ICD-10-CM

## 2017-06-01 DIAGNOSIS — M25562 Pain in left knee: Secondary | ICD-10-CM
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
## 2017-06-06 ENCOUNTER — Ambulatory Visit: Payer: Medicaid Other | Admitting: Physical Therapy

## 2017-06-06 ENCOUNTER — Encounter: Payer: Self-pay | Admitting: Physical Therapy

## 2017-06-06 DIAGNOSIS — M25562 Pain in left knee: Secondary | ICD-10-CM | POA: Diagnosis not present

## 2017-06-06 DIAGNOSIS — R2689 Other abnormalities of gait and mobility: Secondary | ICD-10-CM
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
## 2017-06-07 ENCOUNTER — Ambulatory Visit: Payer: Medicaid Other | Admitting: Physical Therapy

## 2017-06-08 ENCOUNTER — Ambulatory Visit: Payer: Medicaid Other | Admitting: Physical Therapy

## 2017-06-13 ENCOUNTER — Ambulatory Visit: Payer: Medicaid Other | Admitting: Physical Therapy

## 2017-06-15 ENCOUNTER — Ambulatory Visit: Payer: Medicaid Other | Admitting: Physical Therapy

## 2017-12-28 ENCOUNTER — Other Ambulatory Visit: Payer: Self-pay | Admitting: Neurology

## 2017-12-28 DIAGNOSIS — G35 Multiple sclerosis: Secondary | ICD-10-CM

## 2018-01-04 ENCOUNTER — Ambulatory Visit: Admission: RE | Admit: 2018-01-04 | Payer: Medicaid Other | Source: Ambulatory Visit

## 2018-01-17 ENCOUNTER — Ambulatory Visit
Admission: RE | Admit: 2018-01-17 | Discharge: 2018-01-17 | Disposition: A | Discharge status: Home / Self Care | Payer: Medicaid Other | Source: Ambulatory Visit | Attending: Neurology | Admitting: Neurology

## 2018-01-17 ENCOUNTER — Encounter: Payer: Self-pay | Admitting: Radiology

## 2018-01-17 DIAGNOSIS — S0990XS Unspecified injury of head, sequela: Secondary | ICD-10-CM | POA: Diagnosis not present

## 2018-01-17 DIAGNOSIS — G35 Multiple sclerosis: Secondary | ICD-10-CM | POA: Insufficient documentation

## 2018-01-17 MED ORDER — GADOBENATE DIMEGLUMINE 529 MG/ML IV SOLN
10.0000 mL | Freq: Once | INTRAVENOUS | Status: AC | PRN
  Administered 2018-01-17: 10 mL via INTRAVENOUS

## 2018-05-15 ENCOUNTER — Emergency Department: Payer: Medicaid Other

## 2018-05-15 ENCOUNTER — Observation Stay: Payer: Medicaid Other

## 2018-05-15 ENCOUNTER — Observation Stay
Admit: 2018-05-15 | Discharge: 2018-05-15 | Disposition: A | Discharge status: Home / Self Care | Payer: Medicaid Other | Attending: Internal Medicine | Admitting: Internal Medicine

## 2018-05-15 ENCOUNTER — Other Ambulatory Visit: Payer: Self-pay

## 2018-05-15 ENCOUNTER — Encounter: Payer: Self-pay | Admitting: Emergency Medicine

## 2018-05-15 ENCOUNTER — Inpatient Hospital Stay
Admission: EM | Admit: 2018-05-15 | Discharge: 2018-05-17 | DRG: 065 | Disposition: A | Discharge status: Home Health | Payer: Medicaid Other | Attending: Internal Medicine | Admitting: Internal Medicine

## 2018-05-15 DIAGNOSIS — Z823 Family history of stroke: Secondary | ICD-10-CM

## 2018-05-15 DIAGNOSIS — R29709 NIHSS score 9: Secondary | ICD-10-CM | POA: Diagnosis present

## 2018-05-15 DIAGNOSIS — Z87820 Personal history of traumatic brain injury: Secondary | ICD-10-CM

## 2018-05-15 DIAGNOSIS — Z90710 Acquired absence of both cervix and uterus: Secondary | ICD-10-CM

## 2018-05-15 DIAGNOSIS — Z833 Family history of diabetes mellitus: Secondary | ICD-10-CM

## 2018-05-15 DIAGNOSIS — F17210 Nicotine dependence, cigarettes, uncomplicated: Secondary | ICD-10-CM | POA: Diagnosis present

## 2018-05-15 DIAGNOSIS — Z79899 Other long term (current) drug therapy: Secondary | ICD-10-CM

## 2018-05-15 DIAGNOSIS — R531 Weakness: Secondary | ICD-10-CM

## 2018-05-15 DIAGNOSIS — G8194 Hemiplegia, unspecified affecting left nondominant side: Secondary | ICD-10-CM | POA: Diagnosis present

## 2018-05-15 DIAGNOSIS — H548 Legal blindness, as defined in USA: Secondary | ICD-10-CM | POA: Diagnosis present

## 2018-05-15 DIAGNOSIS — Z91018 Allergy to other foods: Secondary | ICD-10-CM

## 2018-05-15 DIAGNOSIS — I10 Essential (primary) hypertension: Secondary | ICD-10-CM | POA: Diagnosis present

## 2018-05-15 DIAGNOSIS — I639 Cerebral infarction, unspecified: Principal | ICD-10-CM | POA: Diagnosis present

## 2018-05-15 DIAGNOSIS — Z888 Allergy status to other drugs, medicaments and biological substances status: Secondary | ICD-10-CM

## 2018-05-15 DIAGNOSIS — Z23 Encounter for immunization: Secondary | ICD-10-CM

## 2018-05-15 DIAGNOSIS — H3552 Pigmentary retinal dystrophy: Secondary | ICD-10-CM | POA: Diagnosis present

## 2018-05-15 DIAGNOSIS — Z8249 Family history of ischemic heart disease and other diseases of the circulatory system: Secondary | ICD-10-CM

## 2018-05-15 DIAGNOSIS — Z791 Long term (current) use of non-steroidal anti-inflammatories (NSAID): Secondary | ICD-10-CM

## 2018-05-15 DIAGNOSIS — G35 Multiple sclerosis: Secondary | ICD-10-CM | POA: Diagnosis present

## 2018-05-15 DIAGNOSIS — G629 Polyneuropathy, unspecified: Secondary | ICD-10-CM | POA: Diagnosis present

## 2018-05-15 DIAGNOSIS — F419 Anxiety disorder, unspecified: Secondary | ICD-10-CM | POA: Diagnosis present

## 2018-05-15 LAB — CBC WITH DIFFERENTIAL/PLATELET
Basophils Absolute: 0 10*3/uL (ref 0–0.1)
Basophils Relative: 1 %
Eosinophils Absolute: 0 10*3/uL (ref 0–0.7)
Eosinophils Relative: 1 %
HCT: 40.6 % (ref 35.0–47.0)
HEMOGLOBIN: 13.7 g/dL (ref 12.0–16.0)
LYMPHS ABS: 1.7 10*3/uL (ref 1.0–3.6)
LYMPHS PCT: 33 %
MCH: 30 pg (ref 26.0–34.0)
MCHC: 33.7 g/dL (ref 32.0–36.0)
MCV: 88.8 fL (ref 80.0–100.0)
Monocytes Absolute: 0.5 10*3/uL (ref 0.2–0.9)
Monocytes Relative: 10 %
NEUTROS ABS: 2.8 10*3/uL (ref 1.4–6.5)
NEUTROS PCT: 55 %
Platelets: 181 10*3/uL (ref 150–440)
RBC: 4.57 MIL/uL (ref 3.80–5.20)
RDW: 14.5 % (ref 11.5–14.5)
WBC: 5.1 10*3/uL (ref 3.6–11.0)

## 2018-05-15 LAB — COMPREHENSIVE METABOLIC PANEL
ALT: 20 U/L (ref 0–44)
AST: 28 U/L (ref 15–41)
Albumin: 4.2 g/dL (ref 3.5–5.0)
Alkaline Phosphatase: 63 U/L (ref 38–126)
Anion gap: 9 (ref 5–15)
BUN: 14 mg/dL (ref 6–20)
CHLORIDE: 102 mmol/L (ref 98–111)
CO2: 30 mmol/L (ref 22–32)
Calcium: 9.8 mg/dL (ref 8.9–10.3)
Creatinine, Ser: 0.49 mg/dL (ref 0.44–1.00)
Glucose, Bld: 94 mg/dL (ref 70–99)
POTASSIUM: 3.3 mmol/L — AB (ref 3.5–5.1)
Sodium: 141 mmol/L (ref 135–145)
Total Bilirubin: 0.7 mg/dL (ref 0.3–1.2)
Total Protein: 7.9 g/dL (ref 6.5–8.1)

## 2018-05-15 MED ORDER — VITAMIN E 45 MG (100 UNIT) PO CAPS
1000.0000 [IU] | ORAL_CAPSULE | Freq: Every day | ORAL | Status: DC
  Administered 2018-05-16 – 2018-05-17 (×2): 1000 [IU] via ORAL
  Filled 2018-05-15 (×2): qty 2

## 2018-05-15 MED ORDER — DIMETHYL FUMARATE 240 MG PO CPDR
1.0000 | DELAYED_RELEASE_CAPSULE | Freq: Two times a day (BID) | ORAL | Status: DC
  Administered 2018-05-15 – 2018-05-17 (×4): 240 mg via ORAL
  Filled 2018-05-15 (×4): qty 1

## 2018-05-15 MED ORDER — GABAPENTIN 300 MG PO CAPS
900.0000 mg | ORAL_CAPSULE | Freq: Three times a day (TID) | ORAL | Status: DC
  Administered 2018-05-15 – 2018-05-17 (×5): 900 mg via ORAL
  Filled 2018-05-15 (×2): qty 3
  Filled 2018-05-15: qty 9
  Filled 2018-05-15 (×2): qty 3

## 2018-05-15 MED ORDER — ASPIRIN 300 MG RE SUPP: 300.0000 mg | Freq: Every day | RECTAL | Status: DC

## 2018-05-15 MED ORDER — ACETAMINOPHEN 160 MG/5ML PO SOLN
650.0000 mg | ORAL | Status: DC | PRN
  Filled 2018-05-15: qty 20.3

## 2018-05-15 MED ORDER — AMLODIPINE BESYLATE 10 MG PO TABS
10.0000 mg | ORAL_TABLET | Freq: Every day | ORAL | Status: DC
  Administered 2018-05-16: 09:00:00 10 mg via ORAL
  Filled 2018-05-15: qty 1

## 2018-05-15 MED ORDER — NICOTINE 7 MG/24HR TD PT24
7.0000 mg | MEDICATED_PATCH | Freq: Every day | TRANSDERMAL | Status: DC
  Administered 2018-05-15 – 2018-05-17 (×3): 7 mg via TRANSDERMAL
  Filled 2018-05-15 (×3): qty 1

## 2018-05-15 MED ORDER — LORAZEPAM 2 MG/ML IJ SOLN
0.5000 mg | Freq: Once | INTRAMUSCULAR | Status: AC
  Administered 2018-05-15: 22:00:00 0.5 mg via INTRAVENOUS
  Filled 2018-05-15: qty 1

## 2018-05-15 MED ORDER — ACETAMINOPHEN 650 MG RE SUPP: 650.0000 mg | RECTAL | Status: DC | PRN

## 2018-05-15 MED ORDER — HYDROCHLOROTHIAZIDE 25 MG PO TABS
25.0000 mg | ORAL_TABLET | Freq: Every day | ORAL | Status: DC
  Administered 2018-05-16: 25 mg via ORAL
  Filled 2018-05-15: qty 1

## 2018-05-15 MED ORDER — TRAZODONE HCL 50 MG PO TABS
300.0000 mg | ORAL_TABLET | Freq: Every day | ORAL | Status: DC
  Filled 2018-05-15: qty 6

## 2018-05-15 MED ORDER — POTASSIUM CHLORIDE CRYS ER 10 MEQ PO TBCR: 10.0000 meq | EXTENDED_RELEASE_TABLET | Freq: Every day | ORAL | Status: DC

## 2018-05-15 MED ORDER — LISINOPRIL 10 MG PO TABS
10.0000 mg | ORAL_TABLET | Freq: Every day | ORAL | Status: DC
  Administered 2018-05-16: 09:00:00 10 mg via ORAL
  Filled 2018-05-15: qty 1

## 2018-05-15 MED ORDER — MODAFINIL 100 MG PO TABS
100.0000 mg | ORAL_TABLET | Freq: Every day | ORAL | Status: DC
  Administered 2018-05-16: 100 mg via ORAL
  Filled 2018-05-15: qty 1

## 2018-05-15 MED ORDER — ASPIRIN 325 MG PO TABS
325.0000 mg | ORAL_TABLET | Freq: Every day | ORAL | Status: DC
  Administered 2018-05-16 – 2018-05-17 (×2): 325 mg via ORAL
  Filled 2018-05-15 (×3): qty 1

## 2018-05-15 MED ORDER — ACETAMINOPHEN 325 MG PO TABS: 650.0000 mg | ORAL_TABLET | ORAL | Status: DC | PRN

## 2018-05-15 MED ORDER — STROKE: EARLY STAGES OF RECOVERY BOOK
Freq: Once | Status: AC
  Administered 2018-05-15: 15:00:00

## 2018-05-15 MED ORDER — MORPHINE SULFATE (PF) 4 MG/ML IV SOLN
INTRAVENOUS | Status: AC
  Filled 2018-05-15: qty 1

## 2018-05-15 MED ORDER — POTASSIUM CHLORIDE CRYS ER 10 MEQ PO TBCR
10.0000 meq | EXTENDED_RELEASE_TABLET | Freq: Every day | ORAL | Status: DC
  Administered 2018-05-16 – 2018-05-17 (×2): 10 meq via ORAL
  Filled 2018-05-15 (×2): qty 1

## 2018-05-15 MED ORDER — MELOXICAM 7.5 MG PO TABS
15.0000 mg | ORAL_TABLET | Freq: Every day | ORAL | Status: DC
  Administered 2018-05-16: 09:00:00 15 mg via ORAL
  Filled 2018-05-15 (×2): qty 2

## 2018-05-15 MED ORDER — LORAZEPAM 2 MG/ML IJ SOLN
1.0000 mg | Freq: Once | INTRAMUSCULAR | Status: AC
  Administered 2018-05-15: 1 mg via INTRAVENOUS
  Filled 2018-05-15: qty 1

## 2018-05-15 MED ORDER — METHYLPHENIDATE HCL 5 MG PO TABS
10.0000 mg | ORAL_TABLET | Freq: Every day | ORAL | Status: DC
  Administered 2018-05-16 – 2018-05-17 (×2): 10 mg via ORAL
  Filled 2018-05-15 (×2): qty 2

## 2018-05-15 MED ORDER — DOCUSATE SODIUM 100 MG PO CAPS
100.0000 mg | ORAL_CAPSULE | Freq: Two times a day (BID) | ORAL | Status: DC
  Administered 2018-05-16 – 2018-05-17 (×3): 100 mg via ORAL
  Filled 2018-05-15 (×4): qty 1

## 2018-05-15 MED ORDER — PANTOPRAZOLE SODIUM 40 MG PO TBEC
40.0000 mg | DELAYED_RELEASE_TABLET | Freq: Every day | ORAL | Status: DC
  Administered 2018-05-16 – 2018-05-17 (×2): 40 mg via ORAL
  Filled 2018-05-15 (×2): qty 1

## 2018-05-15 MED ORDER — ENOXAPARIN SODIUM 40 MG/0.4ML ~~LOC~~ SOLN
40.0000 mg | SUBCUTANEOUS | Status: DC
  Administered 2018-05-16: 40 mg via SUBCUTANEOUS
  Filled 2018-05-15 (×2): qty 0.4

## 2018-05-15 MED ORDER — PNEUMOCOCCAL VAC POLYVALENT 25 MCG/0.5ML IJ INJ
0.5000 mL | INJECTION | INTRAMUSCULAR | Status: AC
  Administered 2018-05-16: 09:00:00 0.5 mL via INTRAMUSCULAR
  Filled 2018-05-15: qty 0.5
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
## 2018-05-15 NOTE — H&P (Signed)
[place] [facility] [place] NAME: [name]    MR#:  [id number]  DATE OF BIRTH:  05/09/1966  DATE OF ADMISSION:  05/15/2018  PRIMARY CARE PHYSICIAN: Shella Spearing, MD   REQUESTING/REFERRING PHYSICIAN: Corinna Capra, MD  CHIEF COMPLAINT:   Chief Complaint  Patient presents with  . Weakness    HISTORY OF PRESENT ILLNESS: [name]  is a 52 y.o. female with a known history of essential hypertension, multiple sclerosis, retinitis pigmentosa who is presenting with left-sided weakness.  Patient states that last night she had some pain in her right eye and then progressed to numbness and weakness in the left arm and left leg.  She is normally goes to the gym.  She states that she is having difficulty with walking since the symptoms started.  CT scan of the head was negative.  PAST MEDICAL HISTORY:   Past Medical History:  Diagnosis Date  . Hypertension   . Legally blind   . Multiple sclerosis ([place]) 2001  . Multiple sclerosis ([place])   . Retinitis pigmentosa 2007    PAST SURGICAL HISTORY:  Past Surgical History:  Procedure Laterality Date  . ABDOMINAL HYSTERECTOMY    . CYST REMOVAL NECK     spine    SOCIAL HISTORY:  Social History   Tobacco Use  . Smoking status: Current Every Day Smoker    Packs/day: 0.33    Years: 30.00    Pack years: 9.90    Types: Cigarettes  . Smokeless tobacco: Never Used  . Tobacco comment: Pt reports that she wants to quit, but that she is not actively  quitting  Substance Use Topics  . Alcohol use: No    FAMILY HISTORY: No family history on file.  DRUG ALLERGIES:  Allergies  Allergen Reactions  . Strawberry (Diagnostic) Swelling  . Carbamazepine Rash  . Carbamazepine Itching and Rash    REVIEW OF SYSTEMS:   CONSTITUTIONAL: No fever, fatigue or weakness.  EYES: No blurred or double vision.  EARS, NOSE, AND THROAT: No tinnitus or ear pain.  RESPIRATORY: No cough, shortness of breath, wheezing or  hemoptysis.  CARDIOVASCULAR: No chest pain, orthopnea, edema.  GASTROINTESTINAL: No nausea, vomiting, diarrhea or abdominal pain.  GENITOURINARY: No dysuria, hematuria.  ENDOCRINE: No polyuria, nocturia,  HEMATOLOGY: No anemia, easy bruising or bleeding SKIN: No rash or lesion. MUSCULOSKELETAL: No joint pain or arthritis.   NEUROLOGIC: Left-sided weakness in the arm and leg PSYCHIATRY: No anxiety or depression.   MEDICATIONS AT HOME:  Prior to Admission medications   Medication Sig Start Date End Date Taking? Authorizing Provider  amLODipine (NORVASC) 10 MG tablet Take 10 mg by mouth daily.   Yes [provider]  Dimethyl Fumarate (TECFIDERA) 240 MG CPDR Take 1 capsule by mouth 2 (two) times daily.   Yes [provider]  docusate sodium (COLACE) 100 MG capsule Take 100 mg by mouth 2 (two) times daily.   Yes [provider]  gabapentin (NEURONTIN) 300 MG capsule Take 900 mg by mouth 3 (three) times daily.    Yes [provider]  hydrochlorothiazide (HYDRODIURIL) 25 MG tablet Take 25 mg by mouth daily.   Yes [provider]  Interferon Beta-1b (BETASERON) 0.3 MG KIT injection Inject 0.3 mg into the skin every other day. 11/25/15  Yes [provider]  lisinopril (PRINIVIL,ZESTRIL) 10 MG tablet Take 1 tablet by mouth daily. 05/14/18  Yes [provider]  meloxicam (MOBIC) 15 MG tablet Take  1 tablet by mouth daily. 04/12/18  Yes [provider]  methylphenidate (RITALIN) 10 MG tablet Take 10 mg by mouth daily. 05/02/18  Yes [provider]  modafinil (PROVIGIL) 100 MG tablet Take 1 tablet by mouth daily. 05/12/17  Yes [provider]  pantoprazole (PROTONIX) 40 MG tablet Take 40 mg by mouth daily.   Yes [provider]  potassium chloride (K-DUR,KLOR-CON) 10 MEQ tablet Take 1 tablet by mouth daily. 04/12/18  Yes [provider]  senna (SENOKOT) 8.6 MG TABS tablet Take 8.6 mg by mouth 2 (two)  times daily. 05/14/18  Yes [provider]  trazodone (DESYREL) 300 MG tablet Take 1 tablet (300 mg total) by mouth at bedtime. 02/02/17  Yes Angiulli, Lavon Paganini, PA-C  vitamin E (VITAMIN E) 1000 UNIT capsule Take 1,000 Units by mouth daily.   Yes [provider]  clonazePAM (KLONOPIN) 0.5 MG tablet Take 1 tablet (0.5 mg total) by mouth 2 (two) times daily. Patient not taking: Reported on 05/15/2018 02/02/17   Angiulli, Lavon Paganini, PA-C  divalproex (DEPAKOTE) 250 MG DR tablet Take 1 tablet (250 mg total) by mouth every 12 (twelve) hours. Patient not taking: Reported on 04/11/2017 02/02/17   Angiulli, Lavon Paganini, PA-C  nicotine (NICODERM CQ - DOSED IN MG/24 HR) 7 mg/24hr patch Place 7 mg onto the skin daily.    [provider]  oxyCODONE (OXY IR/ROXICODONE) 5 MG immediate release tablet Take 1 tablet (5 mg total) by mouth every 4 (four) hours as needed for moderate pain. Patient not taking: Reported on 04/11/2017 02/02/17   Angiulli, Lavon Paganini, PA-C  tiZANidine (ZANAFLEX) 4 MG tablet Take 1 tablet (4 mg total) by mouth every 6 (six) hours as needed for muscle spasms. Patient not taking: Reported on 04/11/2017 02/02/17   Angiulli, Lavon Paganini, PA-C      PHYSICAL EXAMINATION:   VITAL SIGNS: Blood pressure (!) 141/97, pulse 75, temperature 98.8 F (37.1 C), resp. rate 20, height 5' (1.524 m), weight 54.4 kg (120 lb), SpO2 100 %.  GENERAL:  52 y.o.-year-old patient lying in the bed with no acute distress.  EYES: Pupils equal, round, reactive to light and accommodation. No scleral icterus. Extraocular muscles intact.  HEENT: Head atraumatic, normocephalic. Oropharynx and nasopharynx clear.  NECK:  Supple, no jugular venous distention. No thyroid enlargement, no tenderness.  LUNGS: Normal breath sounds bilaterally, no wheezing, rales,rhonchi or crepitation. No use of accessory muscles of respiration.  CARDIOVASCULAR: S1, S2 normal. No murmurs, rubs, or gallops.  ABDOMEN: Soft, nontender,  nondistended. Bowel sounds present. No organomegaly or mass.  EXTREMITIES: No pedal edema, cyanosis, or clubbing.  NEUROLOGIC: Cranial nerves II through XII are intact.  Decreased strength in the left upper and left lower extremity sensation diminished on the left side PSYCHIATRIC: The patient is alert and oriented x 3.  SKIN: No obvious rash, lesion, or ulcer.   LABORATORY PANEL:   CBC Recent Labs  Lab 05/15/18 1129  WBC 5.1  HGB 13.7  HCT 40.6  PLT 181  MCV 88.8  MCH 30.0  MCHC 33.7  RDW 14.5  LYMPHSABS 1.7  MONOABS 0.5  EOSABS 0.0  BASOSABS 0.0   ------------------------------------------------------------------------------------------------------------------  Chemistries  Recent Labs  Lab 05/15/18 1129  NA 141  K 3.3*  CL 102  CO2 30  GLUCOSE 94  BUN 14  CREATININE 0.49  CALCIUM 9.8  AST 28  ALT 20  ALKPHOS 63  BILITOT 0.7   ------------------------------------------------------------------------------------------------------------------ estimated creatinine clearance is 57.7 mL/min (  by C-G formula based on SCr of 0.49 mg/dL). ------------------------------------------------------------------------------------------------------------------ No results for input(s): TSH, T4TOTAL, T3FREE, THYROIDAB in the last 72 hours.  Invalid input(s): FREET3   Coagulation profile No results for input(s): INR, PROTIME in the last 168 hours. ------------------------------------------------------------------------------------------------------------------- No results for input(s): DDIMER in the last 72 hours. -------------------------------------------------------------------------------------------------------------------  Cardiac Enzymes No results for input(s): CKMB, TROPONINI, MYOGLOBIN in the last 168 hours.  Invalid input(s): CK ------------------------------------------------------------------------------------------------------------------ Invalid input(s):  POCBNP  ---------------------------------------------------------------------------------------------------------------  Urinalysis    Component Value Date/Time   COLORURINE YELLOW 01/27/2017 1351   APPEARANCEUR HAZY (A) 01/27/2017 1351   LABSPEC 1.016 01/27/2017 1351   PHURINE 6.0 01/27/2017 1351   GLUCOSEU NEGATIVE 01/27/2017 1351   HGBUR NEGATIVE 01/27/2017 1351   BILIRUBINUR NEGATIVE 01/27/2017 1351   KETONESUR NEGATIVE 01/27/2017 1351   PROTEINUR NEGATIVE 01/27/2017 1351   NITRITE NEGATIVE 01/27/2017 1351   LEUKOCYTESUR NEGATIVE 01/27/2017 1351     RADIOLOGY: Ct Head Wo Contrast  Result Date: 05/15/2018 CLINICAL DATA:  Left-sided weakness.  History of multiple sclerosis. EXAM: CT HEAD WITHOUT CONTRAST TECHNIQUE: Contiguous axial images were obtained from the base of the skull through the vertex without intravenous contrast. COMPARISON:  MR brain dated January 17, 2018. CT head dated January 17, 2017. FINDINGS: Brain: No evidence of acute infarction, hemorrhage, hydrocephalus, extra-axial collection or mass lesion/mass effect. Scattered periventricular white matter hypodensities are grossly unchanged, and remain consistent with patient's history of multiple sclerosis. Vascular: No hyperdense vessel or unexpected calcification. Skull: Normal. Negative for fracture or focal lesion. Sinuses/Orbits: No acute finding. Other: None. IMPRESSION: 1.  No acute intracranial abnormality. Electronically Signed   By: Titus Dubin M.D.   On: 05/15/2018 12:00   Dg Chest Portable 1 View  Result Date: 05/15/2018 CLINICAL DATA:  Left-sided weakness which began last night weak grip left side EXAM: PORTABLE CHEST 1 VIEW COMPARISON:  Portable chest x-ray of 01/16/2017 FINDINGS: No active infiltrate or effusion is seen. However, located between the anterior right first and second ribs there is a nodular opacity not seen previously. Either follow-up chest x-ray or CT the chest is recommended to assess  further. Mediastinal and hilar contours are unremarkable. The heart is within upper limits of normal. No acute bony abnormality is seen. IMPRESSION: 1. Nodular opacity between the anterior right first and second ribs not present previously. Consider follow-up chest x-ray or CT of the chest without contrast media. 2. Heart upper normal in size. Electronically Signed   By: Ivar Drape M.D.   On: 05/15/2018 11:46    EKG: Orders placed or performed during the hospital encounter of 05/15/18  . ED EKG  . ED EKG  . EKG 12-Lead  . EKG 12-Lead    IMPRESSION AND PLAN: Patient is a 52 year old with history of multiple sclerosis presenting with left-sided weakness  1.  Left-sided weakness differential includes CVA versus multiple sclerosis flare We will place patient under observation Obtain MRI and MRI of the brain We will obtain a stroke work-up Also symptoms could be related to MS flare neurology consult will be pursued PT evaluation  2.  Essential hypertension we will obtain patient's home medication list resume her home medications  3.  History of posttraumatic brain syndrome continue her regimen as taking at home  4.  Miscellaneous Lovenox for DVT prophylax  All the records are reviewed and case discussed with ED provider. Management plans discussed with the patient, family and they are in agreement.  CODE STATUS: Code Status History    Date  Active Date Inactive Code Status Order ID Comments User Context   01/20/2017 1735 02/02/2017 1653 Full Code [id number]  [facility] Sauer Inpatient   01/20/2017 1735 01/20/2017 1735 Full Code [id number]  Elizabeth Sauer Inpatient   01/16/2017 1647 01/20/2017 1719 Full Code [id number]  Judeth Horn, MD ED       TOTAL TIME TAKING CARE OF THIS PATIENT: 55 minutes.    Dustin Flock M.D on 05/15/2018 at 1:44 PM  Between 7am to 6pm - Pager - [phone]  After 6pm go to [email or web address] - password [email or web address] [facility]   [phone]  CC: Primary care physician; Shella Spearing, MD

## 2018-05-15 NOTE — ED Triage Notes (Signed)
Pt arrived via ems from home with complaints of left sided weakeness that started last night around 1800. Pt states the weakness increased today and traveled to her face. Grips weaker on left side and left leg dropped before 5 seconds. No facial droop or slurred speech. Pt alert & oriented x 4.

## 2018-05-15 NOTE — ED Notes (Signed)
Pt finished at MRI 1C called and informed pt is on her way

## 2018-05-15 NOTE — Evaluation (Addendum)
Clinical/Bedside Swallow Evaluation Patient Details  Name: [name] MRN: [id number] Date of Birth: September 19, 1966  Today's Date: 05/15/2018 Time: SLP Start Time (ACUTE ONLY): 1600 SLP Stop Time (ACUTE ONLY): 1700 SLP Time Calculation (min) (ACUTE ONLY): 60 min  Past Medical History:  Past Medical History:  Diagnosis Date  . Hypertension   . Legally blind   . Multiple sclerosis (HCC) 2001  . Multiple sclerosis (HCC)   . Retinitis pigmentosa 2007   Past Surgical History:  Past Surgical History:  Procedure Laterality Date  . ABDOMINAL HYSTERECTOMY    . CYST REMOVAL NECK     spine   HPI:   Pt is a 52 y.o. female with a known history of essential hypertension, multiple sclerosis, retinitis pigmentosa who is presenting with left-sided weakness.  Patient states that last night she had some pain in her right eye and then progressed to numbness and weakness in the left arm and left leg.  She is normally goes to the gym.  She states that she is having difficulty with walking since the symptoms started. Currently, pt denies any speech-language deficits. Pt does have a baseline of MS and numbness in the Left side of her face; speech appropriate. Pt spoke frequently of her upcoming wedding w/ SLP and desire to quit smoking. Pt is legally blind.     Assessment / Plan / Recommendation Clinical Impression  Pt appears to present w/ adequate oropharyngeal phase swallow function w/ no overt s/s of aspiration noted w/ oral intake; no oropharyngeal phase swallowing deficits appreciated. Oral phase appeared [facility] for bolus management and A-P transfer; oral clearing noted. No overt coughing noted w/ trials of liquids/foods; clear vocal quality b/t trials. No decline in respiratory status apparent during/post trials. Pt appears at reduced risk for aspiration following general precautions. Pt is legally blind and benefits from support w/ tray setup at meals. Pt also has a baseline of MS.  Recommend a regular diet  w/ thin liquids; general aspiration precautions during meals as reviewed. No further skilled ST services indicated at this time. NSG to reconsult if any decline in status while admitted. NSG updated/agreed. Pt agreed.  Of note, pt denied any speech-language deficits stating she felt at her baseline w/ her language skills; articulation of speech clear. No further needs; pt agreed.  SLP Visit Diagnosis: Dysphagia, unspecified (R13.10)(baseline of MS)    Aspiration Risk  (reduced following general precautions)    Diet Recommendation  Regular diet w/ thin liquids; general aspiration precautions and tray setup as needed.   Medication Administration: Whole meds with liquid(as tolerates)    Other  Recommendations Recommended Consults: (none) Oral Care Recommendations: Oral care BID;Patient independent with oral care;Staff/trained caregiver to provide oral care(support) Other Recommendations: (n/a)   Follow up Recommendations None      Frequency and Duration (n/a)  (n/a)       Prognosis Prognosis for Safe Diet Advancement: Good Barriers to Reach Goals: (baseline of MS; legally blind)      Swallow Study   General Date of Onset: 05/15/18 Type of Study: Bedside Swallow Evaluation Previous Swallow Assessment: none Diet Prior to this Study: Regular;Thin liquids(at home per pt) Temperature Spikes Noted: No(wbc not elevated; 5.1) Respiratory Status: Room air History of Recent Intubation: No Behavior/Cognition: Alert;Cooperative;Pleasant mood(legally blind) Oral Cavity Assessment: Within Functional Limits Oral Care Completed by SLP: No Oral Cavity - Dentition: Adequate natural dentition Vision: Impaired for self-feeding(legally blind at baseline; needs tray setup) Self-Feeding Abilities: Able to feed self;Needs assist;Needs set up  Patient Positioning: Upright in bed Baseline Vocal Quality: Normal Volitional Cough: Strong Volitional Swallow: Able to elicit    Oral/Motor/Sensory Function  Overall Oral Motor/Sensory Function: Within functional limits   Ice Chips Ice chips: Not tested   Thin Liquid Thin Liquid: Within functional limits Presentation: Cup;Self Fed;Straw(3-4 ozs total)    Nectar Thick Nectar Thick Liquid: Not tested   Honey Thick Honey Thick Liquid: Not tested   Puree Puree: Within functional limits Presentation: Self Fed;Spoon(4 ozs)   Solid   GO   Solid: Within functional limits Presentation: Self Fed(graham crackers; 4)           Jerilynn Som, MS, CCC-SLP Kaysea Raya 05/15/2018,5:31 PM

## 2018-05-15 NOTE — ED Notes (Signed)
Patient transported to MRI 

## 2018-05-15 NOTE — ED Notes (Signed)
Patient transported to CT 

## 2018-05-16 DIAGNOSIS — H3552 Pigmentary retinal dystrophy: Secondary | ICD-10-CM | POA: Diagnosis present

## 2018-05-16 DIAGNOSIS — R29709 NIHSS score 9: Secondary | ICD-10-CM | POA: Diagnosis present

## 2018-05-16 DIAGNOSIS — Z888 Allergy status to other drugs, medicaments and biological substances status: Secondary | ICD-10-CM | POA: Diagnosis not present

## 2018-05-16 DIAGNOSIS — I639 Cerebral infarction, unspecified: Principal | ICD-10-CM

## 2018-05-16 DIAGNOSIS — Z79899 Other long term (current) drug therapy: Secondary | ICD-10-CM | POA: Diagnosis not present

## 2018-05-16 DIAGNOSIS — Z8249 Family history of ischemic heart disease and other diseases of the circulatory system: Secondary | ICD-10-CM | POA: Diagnosis not present

## 2018-05-16 DIAGNOSIS — Z833 Family history of diabetes mellitus: Secondary | ICD-10-CM | POA: Diagnosis not present

## 2018-05-16 DIAGNOSIS — G629 Polyneuropathy, unspecified: Secondary | ICD-10-CM | POA: Diagnosis present

## 2018-05-16 DIAGNOSIS — Z823 Family history of stroke: Secondary | ICD-10-CM | POA: Diagnosis not present

## 2018-05-16 DIAGNOSIS — H548 Legal blindness, as defined in USA: Secondary | ICD-10-CM | POA: Diagnosis present

## 2018-05-16 DIAGNOSIS — Z90710 Acquired absence of both cervix and uterus: Secondary | ICD-10-CM | POA: Diagnosis not present

## 2018-05-16 DIAGNOSIS — G8194 Hemiplegia, unspecified affecting left nondominant side: Secondary | ICD-10-CM | POA: Diagnosis present

## 2018-05-16 DIAGNOSIS — I10 Essential (primary) hypertension: Secondary | ICD-10-CM | POA: Diagnosis present

## 2018-05-16 DIAGNOSIS — F17210 Nicotine dependence, cigarettes, uncomplicated: Secondary | ICD-10-CM | POA: Diagnosis present

## 2018-05-16 DIAGNOSIS — Z23 Encounter for immunization: Secondary | ICD-10-CM | POA: Diagnosis not present

## 2018-05-16 DIAGNOSIS — F419 Anxiety disorder, unspecified: Secondary | ICD-10-CM | POA: Diagnosis present

## 2018-05-16 DIAGNOSIS — Z87820 Personal history of traumatic brain injury: Secondary | ICD-10-CM | POA: Diagnosis not present

## 2018-05-16 DIAGNOSIS — Z791 Long term (current) use of non-steroidal anti-inflammatories (NSAID): Secondary | ICD-10-CM | POA: Diagnosis not present

## 2018-05-16 DIAGNOSIS — Z91018 Allergy to other foods: Secondary | ICD-10-CM | POA: Diagnosis not present

## 2018-05-16 DIAGNOSIS — G35 Multiple sclerosis: Secondary | ICD-10-CM | POA: Diagnosis present

## 2018-05-16 LAB — LIPID PANEL
CHOL/HDL RATIO: 2.5 ratio
CHOLESTEROL: 210 mg/dL — AB (ref 0–200)
HDL: 85 mg/dL (ref >40)
LDL Cholesterol: 116 mg/dL — ABNORMAL HIGH (ref 0–99)
TRIGLYCERIDES: 45 mg/dL (ref <150)
VLDL: 9 mg/dL (ref 0–40)

## 2018-05-16 LAB — HEMOGLOBIN A1C
Hgb A1c MFr Bld: 5.6 % (ref 4.8–5.6)
MEAN PLASMA GLUCOSE: 114.02 mg/dL

## 2018-05-16 MED ORDER — ATORVASTATIN CALCIUM 20 MG PO TABS
40.0000 mg | ORAL_TABLET | Freq: Every day | ORAL | Status: DC
  Administered 2018-05-16: 40 mg via ORAL
  Filled 2018-05-16: qty 2

## 2018-05-16 MED ORDER — TRAZODONE HCL 50 MG PO TABS
150.0000 mg | ORAL_TABLET | Freq: Every day | ORAL | Status: DC
  Administered 2018-05-16: 21:00:00 150 mg via ORAL
  Filled 2018-05-16: qty 3
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
## 2018-05-16 NOTE — Progress Notes (Signed)
Patient ID: [name], female   DOB: Apr 10, 1966, 52 y.o.   MRN: [id number]  [employer] PROGRESS NOTE  [name] WUJ:[id number] DOB: Nov 03, 1966 DOA: 05/15/2018 PCP: Janece Canterbury, MD  HPI/Subjective: Patient cant understand why she has a stroke.  Patient is a smoker and has hypertension and MS.  She presented with left-sided weakness and numbness.  Objective: Vitals:   05/16/18 1202 05/16/18 1242  BP: (!) 143/85 (!) 157/88  Pulse: 69 70  Resp: 16 20  Temp: 98.3 F (36.8 C) 98.3 F (36.8 C)  SpO2: 100% 100%   No intake or output data in the 24 hours ending 05/16/18 1455 Filed Weights   05/15/18 1123 05/15/18 1440  Weight: 54.4 kg (120 lb) 55.7 kg (122 lb 14.4 oz)    ROS: Review of Systems  Constitutional: Negative for chills and fever.  Eyes: Negative for blurred vision.  Respiratory: Negative for cough and shortness of breath.   Cardiovascular: Negative for chest pain.  Gastrointestinal: Negative for abdominal pain, constipation, diarrhea, nausea and vomiting.  Genitourinary: Negative for dysuria.  Musculoskeletal: Negative for joint pain.  Neurological: Positive for tingling and focal weakness. Negative for dizziness and headaches.   Exam: Physical Exam  Constitutional: She is oriented to person, place, and time.  HENT:  Nose: No mucosal edema.  Mouth/Throat: No oropharyngeal exudate or posterior oropharyngeal edema.  Eyes: Pupils are equal, round, and reactive to light. Conjunctivae, EOM and lids are normal.  Neck: No JVD present. Carotid bruit is not present. No edema present. No thyroid mass and no thyromegaly present.  Cardiovascular: S1 normal and S2 normal. Exam reveals no gallop.  No murmur heard. Pulses:      Dorsalis pedis pulses are 2+ on the right side, and 2+ on the left side.  Respiratory: No respiratory distress. She has no wheezes. She has no rhonchi. She has no rales.  GI: Soft. Bowel sounds are normal. There is no tenderness.   Musculoskeletal:       Right shoulder: She exhibits no swelling.  Lymphadenopathy:    She has no cervical adenopathy.  Neurological: She is alert and oriented to person, place, and time. No cranial nerve deficit.  Left-sided power 4 out of 5  left side upper and lower extremity.  Right-sided power 5 out of 5 upper and lower extremity  Skin: Skin is warm. No rash noted. Nails show no clubbing.  Psychiatric: She has a normal mood and affect.      Data Reviewed: Basic Metabolic Panel: Recent Labs  Lab 05/15/18 1129  NA 141  K 3.3*  CL 102  CO2 30  GLUCOSE 94  BUN 14  CREATININE 0.49  CALCIUM 9.8   Liver Function Tests: Recent Labs  Lab 05/15/18 1129  AST 28  ALT 20  ALKPHOS 63  BILITOT 0.7  PROT 7.9  ALBUMIN 4.2   CBC: Recent Labs  Lab 05/15/18 1129  WBC 5.1  NEUTROABS 2.8  HGB 13.7  HCT 40.6  MCV 88.8  PLT 181   Studies: Ct Head Wo Contrast  Result Date: 05/15/2018 CLINICAL DATA:  Left-sided weakness.  History of multiple sclerosis. EXAM: CT HEAD WITHOUT CONTRAST TECHNIQUE: Contiguous axial images were obtained from the base of the skull through the vertex without intravenous contrast. COMPARISON:  MR brain dated January 17, 2018. CT head dated January 17, 2017. FINDINGS: Brain: No evidence of acute infarction, hemorrhage, hydrocephalus, extra-axial collection or mass lesion/mass effect. Scattered periventricular white matter hypodensities are grossly unchanged,  and remain consistent with patient's history of multiple sclerosis. Vascular: No hyperdense vessel or unexpected calcification. Skull: Normal. Negative for fracture or focal lesion. Sinuses/Orbits: No acute finding. Other: None. IMPRESSION: 1.  No acute intracranial abnormality. Electronically Signed   By: Obie Dredge M.D.   On: 05/15/2018 12:00   Mr Brain Wo Contrast  Result Date: 05/15/2018 CLINICAL DATA:  Focal neuro deficit, stroke suspected. Numbness and weakness in the left arm and left leg.  EXAM: MRI HEAD WITHOUT CONTRAST TECHNIQUE: Multiplanar, multiecho pulse sequences of the brain and surrounding structures were obtained without intravenous contrast. COMPARISON:  01/17/2018 FINDINGS: Brain: 12 x 6 mm ovoid area of restricted diffusion in the right pons. There is FLAIR hyperintensity around the lateral ventricles with both confluent rim and discrete ovoid radiating pattern. Patient has history of multiple sclerosis. There have been remote micro hemorrhages in the left thalamus and subcortical right frontal lobe. Patient also has history of hypertension. No acute hemorrhage, hydrocephalus, or masslike finding Vascular: Major flow voids are preserved Skull and upper cervical spine: Negative for marrow lesion Sinuses/Orbits: Negative IMPRESSION: 12 x 7 mm focus of restricted diffusion in the right pons. The shape favors an acute infarct over active demyelination related to patient's multiple sclerosis. Electronically Signed   By: Marnee Spring M.D.   On: 05/15/2018 14:36   US Carotid Bilateral (at [facility] Only)  Result Date: 05/15/2018 CLINICAL DATA:  Stroke. Left arm weakness and tingling for the past day. History of prior myocardial infarction and smoking. EXAM: BILATERAL CAROTID DUPLEX ULTRASOUND TECHNIQUE: Wallace Cullens scale imaging, color Doppler and duplex ultrasound were performed of bilateral carotid and vertebral arteries in the neck. COMPARISON:  None. FINDINGS: Criteria: Quantification of carotid stenosis is based on velocity parameters that correlate the residual internal carotid diameter with NASCET-based stenosis levels, using the diameter of the distal internal carotid lumen as the denominator for stenosis measurement. The following velocity measurements were obtained: RIGHT ICA:  83/33 cm/sec CCA:  79/16 cm/sec SYSTOLIC ICA/CCA RATIO:  1.1 ECA:  74 cm/sec LEFT ICA:  53/9 cm/sec CCA:  67/16 cm/sec SYSTOLIC ICA/CCA RATIO:  0.8 ECA:  75 cm/sec RIGHT CAROTID ARTERY: There is no grayscale  evidence of significant intimal thickening or atherosclerotic plaque affecting the interrogated portions of the right carotid system. There are no elevated peak systolic velocities within the interrogated course of the right internal carotid artery to suggest a hemodynamically significant stenosis. RIGHT VERTEBRAL ARTERY:  Antegrade flow LEFT CAROTID ARTERY: There is no grayscale evidence of significant intimal thickening or atherosclerotic plaque affecting the interrogated portions of the left carotid system. There are no elevated peak systolic velocities within the interrogated course of the left internal carotid artery to suggest a hemodynamically significant stenosis. LEFT VERTEBRAL ARTERY:  Antegrade Flow IMPRESSION: Normal carotid Doppler ultrasound. Electronically Signed   By: Simonne Come M.D.   On: 05/15/2018 16:20   Dg Chest Portable 1 View  Result Date: 05/15/2018 CLINICAL DATA:  Left-sided weakness which began last night weak grip left side EXAM: PORTABLE CHEST 1 VIEW COMPARISON:  Portable chest x-ray of 01/16/2017 FINDINGS: No active infiltrate or effusion is seen. However, located between the anterior right first and second ribs there is a nodular opacity not seen previously. Either follow-up chest x-ray or CT the chest is recommended to assess further. Mediastinal and hilar contours are unremarkable. The heart is within upper limits of normal. No acute bony abnormality is seen. IMPRESSION: 1. Nodular opacity between the anterior right first and  second ribs not present previously. Consider follow-up chest x-ray or CT of the chest without contrast media. 2. Heart upper normal in size. Electronically Signed   By: Dwyane Dee M.D.   On: 05/15/2018 11:46   Mr Maxine Glenn Head/brain ZO Cm  Result Date: 05/15/2018 CLINICAL DATA:  Acute infarct.  History of multiple sclerosis. EXAM: MRA HEAD WITHOUT CONTRAST TECHNIQUE: Angiographic images of the Circle of Willis were obtained using MRA technique without  intravenous contrast. COMPARISON:  Brain MRI 05/15/2018. FINDINGS: ANTERIOR CIRCULATION: --Intracranial internal carotid arteries: Normal. --Anterior cerebral arteries: Normal. Both A1 segments are present. Patent anterior communicating artery. --Middle cerebral arteries: Normal. --Posterior communicating arteries: Absent bilaterally. POSTERIOR CIRCULATION: --Basilar artery: Normal. --Posterior cerebral arteries: Normal. --Superior cerebellar arteries: Normal. --Inferior cerebellar arteries: Normal anterior and posterior inferior cerebellar arteries. IMPRESSION: Normal intracranial MRA. Electronically Signed   By: Deatra Robinson M.D.   On: 05/15/2018 22:36    Scheduled Meds: . amLODipine  10 mg Oral Daily  . aspirin  300 mg Rectal Daily   Or  . aspirin  325 mg Oral Daily  . atorvastatin  40 mg Oral q1800  . Dimethyl Fumarate  1 capsule Oral BID  . docusate sodium  100 mg Oral BID  . enoxaparin (LOVENOX) injection  40 mg Subcutaneous Q24H  . gabapentin  900 mg Oral TID  . hydrochlorothiazide  25 mg Oral Daily  . lisinopril  10 mg Oral Daily  . meloxicam  15 mg Oral Daily  . methylphenidate  10 mg Oral Daily  . modafinil  100 mg Oral Daily  . nicotine  7 mg Transdermal Daily  . pantoprazole  40 mg Oral Daily  . potassium chloride  10 mEq Oral Daily  . trazodone  300 mg Oral QHS  . vitamin E  1,000 Units Oral Daily   Continuous Infusions:  Assessment/Plan:  1. Acute CVA affecting right pons with left-sided weakness.  Aspirin and atorvastatin ordered.  Still awaiting echocardiogram to be read.  Patient not a candidate for acute rehab at this time.  Patient declined nursing home rehab.  Likely home with home health tomorrow. 2. Essential hypertension on hydrochlorothiazide lisinopril and Norvasc 3. History of MS 4. Neuropathy on gabapentin 5. Tobacco abuse.  Smoking cessation counseling done 4 minutes by me.  Code Status:     Code Status Orders  (From admission, onward)         Start     Ordered   05/15/18 1441  Full code  Continuous     05/15/18 1440    Code Status History    Date Active Date Inactive Code Status Order ID Comments User Context   01/20/2017 1735 02/02/2017 1653 Full Code [id number]  Lynnae Prude Inpatient   01/20/2017 1735 01/20/2017 1735 Full Code [id number]  Lynnae Prude Inpatient   01/16/2017 1647 01/20/2017 1719 Full Code [id number]  Jimmye Norman, MD ED     Family Communication: Fianc at bedside Disposition Plan: Potentially home with home health tomorrow  Consultants:  Neurology  Time spent: 28 minutes  Byrl Latin [employer]

## 2018-05-16 NOTE — Clinical Social Work Note (Signed)
CSW consulted for SNF placement. CSW met with patient and her fiance at bedside. CSW offered to find SNF placement. Patient states that she wants to go home with home health. CSW notified RN CM of patient wishes. CSW signing off. Please re consult if further needs arise.   Candace Garrison MSW, LCSWA [phone] 

## 2018-05-16 NOTE — Consult Note (Signed)
Referring Physician: Leslye Peer    Chief Complaint: Left sided weakness  HPI: [name] is an 52 y.o. female who reports that on Monday evening went out to take the trash and noted her right eye was painful.  When she went in to clean it out noted left sided numbness.  Laid down to rest and when she awakened noted left facial numbness as well.  With no improvement in symptoms presented for evaluation.  Initial NIHSS of 9. Patient with a history of MS.  Sees Dr. Manuella Ghazi at [facility].   Patient no longer with right eye pain.    Date last known well: Date: 05/14/2018 Time last known well: Time: 18:00 tPA Given: No: Outside time window  Past Medical History:  Diagnosis Date  . Hypertension   . Legally blind   . Multiple sclerosis ([place]) 2001  . Multiple sclerosis ([place])   . Retinitis pigmentosa 2007    Past Surgical History:  Procedure Laterality Date  . ABDOMINAL HYSTERECTOMY    . CYST REMOVAL NECK     spine    Family history: Father with cancer and stroke.  Mother with DM.  Sister with HTN and stroke.    Social History:  reports that she has been smoking cigarettes.  She has a 9.90 pack-year smoking history. She has never used smokeless tobacco. She reports that she does not drink alcohol. Her drug history is not on file.  Allergies:  Allergies  Allergen Reactions  . Strawberry (Diagnostic) Swelling  . Carbamazepine Rash  . Carbamazepine Itching and Rash    Medications:  I have reviewed the patient's current medications. Prior to Admission:  Medications Prior to Admission  Medication Sig Dispense Refill Last Dose  . amLODipine (NORVASC) 10 MG tablet Take 10 mg by mouth daily.   05/15/2018 at Unknown time  . Dimethyl Fumarate (TECFIDERA) 240 MG CPDR Take 1 capsule by mouth 2 (two) times daily.   05/15/2018 at Unknown time  . docusate sodium (COLACE) 100 MG capsule Take 100 mg by mouth 2 (two) times daily.   05/15/2018 at Unknown time  . gabapentin (NEURONTIN) 300 MG capsule Take 900 mg  by mouth 3 (three) times daily.    05/15/2018 at Unknown time  . hydrochlorothiazide (HYDRODIURIL) 25 MG tablet Take 25 mg by mouth daily.   05/15/2018 at Unknown time  . Interferon Beta-1b (BETASERON) 0.3 MG KIT injection Inject 0.3 mg into the skin every other day.   05/14/2018 at Unknown time  . lisinopril (PRINIVIL,ZESTRIL) 10 MG tablet Take 1 tablet by mouth daily.  6 05/15/2018 at Unknown time  . meloxicam (MOBIC) 15 MG tablet Take 1 tablet by mouth daily.  1 05/15/2018 at Unknown time  . methylphenidate (RITALIN) 10 MG tablet Take 10 mg by mouth daily.  0 05/15/2018 at Unknown time  . modafinil (PROVIGIL) 100 MG tablet Take 1 tablet by mouth daily.   05/15/2018 at Unknown time  . pantoprazole (PROTONIX) 40 MG tablet Take 40 mg by mouth daily.   05/15/2018 at Unknown time  . potassium chloride (K-DUR,KLOR-CON) 10 MEQ tablet Take 1 tablet by mouth daily.  1 05/15/2018 at Unknown time  . senna (SENOKOT) 8.6 MG TABS tablet Take 8.6 mg by mouth 2 (two) times daily.  11   . trazodone (DESYREL) 300 MG tablet Take 1 tablet (300 mg total) by mouth at bedtime. 3 tablet 0 05/14/2018 at Unknown time  . vitamin E (VITAMIN E) 1000 UNIT capsule Take 1,000 Units by mouth daily.  05/15/2018 at Unknown time  . clonazePAM (KLONOPIN) 0.5 MG tablet Take 1 tablet (0.5 mg total) by mouth 2 (two) times daily. (Patient not taking: Reported on 05/15/2018) 6 tablet 0 Not Taking at Unknown time  . divalproex (DEPAKOTE) 250 MG DR tablet Take 1 tablet (250 mg total) by mouth every 12 (twelve) hours. (Patient not taking: Reported on 04/11/2017) 6 tablet 0 Not Taking at Unknown time  . nicotine (NICODERM CQ - DOSED IN MG/24 HR) 7 mg/24hr patch Place 7 mg onto the skin daily.   PRN at PRN  . oxyCODONE (OXY IR/ROXICODONE) 5 MG immediate release tablet Take 1 tablet (5 mg total) by mouth every 4 (four) hours as needed for moderate pain. (Patient not taking: Reported on 04/11/2017) 6 tablet 0 Not Taking at Unknown time  . tiZANidine  (ZANAFLEX) 4 MG tablet Take 1 tablet (4 mg total) by mouth every 6 (six) hours as needed for muscle spasms. (Patient not taking: Reported on 04/11/2017) 6 tablet 0 Not Taking at Unknown time   Scheduled: . amLODipine  10 mg Oral Daily  . aspirin  300 mg Rectal Daily   Or  . aspirin  325 mg Oral Daily  . Dimethyl Fumarate  1 capsule Oral BID  . docusate sodium  100 mg Oral BID  . enoxaparin (LOVENOX) injection  40 mg Subcutaneous Q24H  . gabapentin  900 mg Oral TID  . hydrochlorothiazide  25 mg Oral Daily  . lisinopril  10 mg Oral Daily  . meloxicam  15 mg Oral Daily  . methylphenidate  10 mg Oral Daily  . modafinil  100 mg Oral Daily  . nicotine  7 mg Transdermal Daily  . pantoprazole  40 mg Oral Daily  . potassium chloride  10 mEq Oral Daily  . trazodone  300 mg Oral QHS  . vitamin E  1,000 Units Oral Daily    ROS: History obtained from the patient  General ROS: negative for - chills, fatigue, fever, night sweats, weight gain or weight loss Psychological ROS: negative for - behavioral disorder, hallucinations, memory difficulties, mood swings or suicidal ideation Ophthalmic ROS: legally blind, eye pain ENT ROS: negative for - epistaxis, nasal discharge, oral lesions, sore throat, tinnitus or vertigo Allergy and Immunology ROS: negative for - hives or itchy/watery eyes Hematological and Lymphatic ROS: negative for - bleeding problems, bruising or swollen lymph nodes Endocrine ROS: negative for - galactorrhea, hair pattern changes, polydipsia/polyuria or temperature intolerance Respiratory ROS: negative for - cough, hemoptysis, shortness of breath or wheezing Cardiovascular ROS: negative for - chest pain, dyspnea on exertion, edema or irregular heartbeat Gastrointestinal ROS: negative for - abdominal pain, diarrhea, hematemesis, nausea/vomiting or stool incontinence Genito-Urinary ROS: negative for - dysuria, hematuria, incontinence or urinary frequency/urgency Musculoskeletal ROS:  negative for - joint swelling or muscular weakness Neurological ROS: as noted in HPI Dermatological ROS: negative for rash and skin lesion changes  Physical Examination: Blood pressure 136/90, pulse 60, temperature 99.2 F (37.3 C), temperature source Oral, resp. rate 16, height 5' (1.524 m), weight 55.7 kg (122 lb 14.4 oz), SpO2 100 %.  HEENT-  Normocephalic, no lesions, without obvious abnormality.  Normal external eye and conjunctiva.  Normal TM's bilaterally.  Normal auditory canals and external ears. Normal external nose, mucus membranes and septum.  Normal pharynx. Cardiovascular- S1, S2 normal, pulses palpable throughout   Lungs- chest clear, no wheezing, rales, normal symmetric air entry Abdomen- soft, non-tender; bowel sounds normal; no masses,  no organomegaly Extremities- no edema  Lymph-no adenopathy palpable Musculoskeletal-no joint tenderness, deformity or swelling Skin-warm and dry, no hyperpigmentation, vitiligo, or suspicious lesions  Neurological Examination   Mental Status: Alert, oriented, thought content appropriate.  Speech fluent without evidence of aphasia.  Able to follow 3 step commands without difficulty.  Some left neglect noted.   Cranial Nerves: II: Legally blind III,IV, VI: ptosis not present, extra-ocular motions intact bilaterally V,VII: smile symmetric, facial light touch sensation decreased on the left VIII: hearing normal bilaterally IX,X: gag reflex present XI: bilateral shoulder shrug XII: midline tongue extension Motor: Right : Upper extremity   5/5    Left:     Upper extremity   4+/5  Lower extremity   5/5     Lower extremity   4-/5 Patient slow to initiate movement on the left Sensory: Pinprick and light touch intact throughout, bilaterally Deep Tendon Reflexes: 2+ and symmetric throughout Plantars: Right: upgoing   Left: upgoing Cerebellar: Normal finger-to-nose and normal heel-to-shin testing  bilaterally Gait: not tested due to safety  concerns    Laboratory Studies:  Basic Metabolic Panel: Recent Labs  Lab 05/15/18 1129  NA 141  K 3.3*  CL 102  CO2 30  GLUCOSE 94  BUN 14  CREATININE 0.49  CALCIUM 9.8    Liver Function Tests: Recent Labs  Lab 05/15/18 1129  AST 28  ALT 20  ALKPHOS 63  BILITOT 0.7  PROT 7.9  ALBUMIN 4.2   No results for input(s): LIPASE, AMYLASE in the last 168 hours. No results for input(s): AMMONIA in the last 168 hours.  CBC: Recent Labs  Lab 05/15/18 1129  WBC 5.1  NEUTROABS 2.8  HGB 13.7  HCT 40.6  MCV 88.8  PLT 181    Cardiac Enzymes: No results for input(s): CKTOTAL, CKMB, CKMBINDEX, TROPONINI in the last 168 hours.  BNP: Invalid input(s): POCBNP  CBG: No results for input(s): GLUCAP in the last 168 hours.  Microbiology: Results for orders placed or performed during the hospital encounter of 01/20/17  Culture, Urine     Status: Abnormal   Collection Time: 01/27/17  1:51 PM  Result Value Ref Range Status   Specimen Description URINE, RANDOM  Final   Special Requests NONE  Final   Culture 30,000 COLONIES/mL PROTEUS MIRABILIS (A)  Final   Report Status 01/29/2017 FINAL  Final   Organism ID, Bacteria PROTEUS MIRABILIS (A)  Final      Susceptibility   Proteus mirabilis - MIC*    AMPICILLIN <=2 SENSITIVE Sensitive     CEFAZOLIN <=4 SENSITIVE Sensitive     CEFTRIAXONE <=1 SENSITIVE Sensitive     CIPROFLOXACIN <=0.25 SENSITIVE Sensitive     GENTAMICIN <=1 SENSITIVE Sensitive     IMIPENEM 4 SENSITIVE Sensitive     NITROFURANTOIN 128 RESISTANT Resistant     TRIMETH/SULFA <=20 SENSITIVE Sensitive     AMPICILLIN/SULBACTAM <=2 SENSITIVE Sensitive     PIP/TAZO <=4 SENSITIVE Sensitive     * 30,000 COLONIES/mL PROTEUS MIRABILIS    Coagulation Studies: No results for input(s): LABPROT, INR in the last 72 hours.  Urinalysis: No results for input(s): COLORURINE, LABSPEC, PHURINE, GLUCOSEU, HGBUR, BILIRUBINUR, KETONESUR, PROTEINUR, UROBILINOGEN, NITRITE,  LEUKOCYTESUR in the last 168 hours.  Invalid input(s): APPERANCEUR  Lipid Panel:    Component Value Date/Time   CHOL 210 (H) 05/16/2018 0621   TRIG 45 05/16/2018 0621   HDL 85 05/16/2018 0621   CHOLHDL 2.5 05/16/2018 0621   VLDL 9 05/16/2018 0621   LDLCALC 116 (H) 05/16/2018 1308  HgbA1C:  Lab Results  Component Value Date   HGBA1C 5.6 05/16/2018    Urine Drug Screen:  No results found for: LABOPIA, COCAINSCRNUR, LABBENZ, AMPHETMU, THCU, LABBARB  Alcohol Level: No results for input(s): ETH in the last 168 hours.  Other results: EKG: sinus rhythm at 66 bpm.  Imaging: Ct Head Wo Contrast  Result Date: 05/15/2018 CLINICAL DATA:  Left-sided weakness.  History of multiple sclerosis. EXAM: CT HEAD WITHOUT CONTRAST TECHNIQUE: Contiguous axial images were obtained from the base of the skull through the vertex without intravenous contrast. COMPARISON:  MR brain dated January 17, 2018. CT head dated January 17, 2017. FINDINGS: Brain: No evidence of acute infarction, hemorrhage, hydrocephalus, extra-axial collection or mass lesion/mass effect. Scattered periventricular white matter hypodensities are grossly unchanged, and remain consistent with patient's history of multiple sclerosis. Vascular: No hyperdense vessel or unexpected calcification. Skull: Normal. Negative for fracture or focal lesion. Sinuses/Orbits: No acute finding. Other: None. IMPRESSION: 1.  No acute intracranial abnormality. Electronically Signed   By: Titus Dubin M.D.   On: 05/15/2018 12:00   Mr Brain Wo Contrast  Result Date: 05/15/2018 CLINICAL DATA:  Focal neuro deficit, stroke suspected. Numbness and weakness in the left arm and left leg. EXAM: MRI HEAD WITHOUT CONTRAST TECHNIQUE: Multiplanar, multiecho pulse sequences of the brain and surrounding structures were obtained without intravenous contrast. COMPARISON:  01/17/2018 FINDINGS: Brain: 12 x 6 mm ovoid area of restricted diffusion in the right pons. There is  FLAIR hyperintensity around the lateral ventricles with both confluent rim and discrete ovoid radiating pattern. Patient has history of multiple sclerosis. There have been remote micro hemorrhages in the left thalamus and subcortical right frontal lobe. Patient also has history of hypertension. No acute hemorrhage, hydrocephalus, or masslike finding Vascular: Major flow voids are preserved Skull and upper cervical spine: Negative for marrow lesion Sinuses/Orbits: Negative IMPRESSION: 12 x 7 mm focus of restricted diffusion in the right pons. The shape favors an acute infarct over active demyelination related to patient's multiple sclerosis. Electronically Signed   By: Monte Fantasia M.D.   On: 05/15/2018 14:36   US Carotid Bilateral (at [facility] Only)  Result Date: 05/15/2018 CLINICAL DATA:  Stroke. Left arm weakness and tingling for the past day. History of prior myocardial infarction and smoking. EXAM: BILATERAL CAROTID DUPLEX ULTRASOUND TECHNIQUE: Pearline Cables scale imaging, color Doppler and duplex ultrasound were performed of bilateral carotid and vertebral arteries in the neck. COMPARISON:  None. FINDINGS: Criteria: Quantification of carotid stenosis is based on velocity parameters that correlate the residual internal carotid diameter with NASCET-based stenosis levels, using the diameter of the distal internal carotid lumen as the denominator for stenosis measurement. The following velocity measurements were obtained: RIGHT ICA:  83/33 cm/sec CCA:  29/92 cm/sec SYSTOLIC ICA/CCA RATIO:  1.1 ECA:  74 cm/sec LEFT ICA:  53/9 cm/sec CCA:  42/68 cm/sec SYSTOLIC ICA/CCA RATIO:  0.8 ECA:  75 cm/sec RIGHT CAROTID ARTERY: There is no grayscale evidence of significant intimal thickening or atherosclerotic plaque affecting the interrogated portions of the right carotid system. There are no elevated peak systolic velocities within the interrogated course of the right internal carotid artery to suggest a hemodynamically  significant stenosis. RIGHT VERTEBRAL ARTERY:  Antegrade flow LEFT CAROTID ARTERY: There is no grayscale evidence of significant intimal thickening or atherosclerotic plaque affecting the interrogated portions of the left carotid system. There are no elevated peak systolic velocities within the interrogated course of the left internal carotid artery to suggest a hemodynamically  significant stenosis. LEFT VERTEBRAL ARTERY:  Antegrade Flow IMPRESSION: Normal carotid Doppler ultrasound. Electronically Signed   By: Sandi Mariscal M.D.   On: 05/15/2018 16:20   Dg Chest Portable 1 View  Result Date: 05/15/2018 CLINICAL DATA:  Left-sided weakness which began last night weak grip left side EXAM: PORTABLE CHEST 1 VIEW COMPARISON:  Portable chest x-ray of 01/16/2017 FINDINGS: No active infiltrate or effusion is seen. However, located between the anterior right first and second ribs there is a nodular opacity not seen previously. Either follow-up chest x-ray or CT the chest is recommended to assess further. Mediastinal and hilar contours are unremarkable. The heart is within upper limits of normal. No acute bony abnormality is seen. IMPRESSION: 1. Nodular opacity between the anterior right first and second ribs not present previously. Consider follow-up chest x-ray or CT of the chest without contrast media. 2. Heart upper normal in size. Electronically Signed   By: Ivar Drape M.D.   On: 05/15/2018 11:46   Mr Jodene Nam Head/brain TA Cm  Result Date: 05/15/2018 CLINICAL DATA:  Acute infarct.  History of multiple sclerosis. EXAM: MRA HEAD WITHOUT CONTRAST TECHNIQUE: Angiographic images of the Circle of Willis were obtained using MRA technique without intravenous contrast. COMPARISON:  Brain MRI 05/15/2018. FINDINGS: ANTERIOR CIRCULATION: --Intracranial internal carotid arteries: Normal. --Anterior cerebral arteries: Normal. Both A1 segments are present. Patent anterior communicating artery. --Middle cerebral arteries: Normal.  --Posterior communicating arteries: Absent bilaterally. POSTERIOR CIRCULATION: --Basilar artery: Normal. --Posterior cerebral arteries: Normal. --Superior cerebellar arteries: Normal. --Inferior cerebellar arteries: Normal anterior and posterior inferior cerebellar arteries. IMPRESSION: Normal intracranial MRA. Electronically Signed   By: Ulyses Jarred M.D.   On: 05/15/2018 22:36    Assessment: 52 y.o. female with a history of MS presenting with complaints of a left hemiparesis.  Patient outside window for tPA.  MRI of the brain reviewed and shows an acute right pontine infarct.  Likely secondary to small vessel disease.  MRA is unremarkable.  Patient on no antiplatelet therapy prior to admission.  Carotid dopplers show no evidence of hemodynamically significant stenosis.  Echocardiogram pending.  A1c 5.6, LDL 116.  Stroke Risk Factors - hypertension and smoking  Plan: 1. PT consult, OT consult, Speech consult 2. Echocardiogram pending.   3. Prophylactic therapy-Antiplatelet med: Aspirin - dose 393m daily 4. Telemetry monitoring 5. Frequent neuro checks 6. Statin for lipid management with target LDL<70. 7. Smoking cessation counseling   LAlexis Goodell MD Neurology [phone]/26/2019, 10:56 AM

## 2018-05-16 NOTE — Evaluation (Signed)
Occupational Therapy Evaluation Patient Details Name: [name] MRN: [id number] DOB: September 07, 1966 Today's Date: 05/16/2018    History of Present Illness Patient is a 52 year old female admitted with CVA following c/o L side weakness.  Imaging is revealed a pontine infarct.  PMH includes multiple sclerosis, retinitis pigmentosa, legal blindness and Htn.   Clinical Impression   Pt seen for OT evaluation this date. Pt lives in a 1 story home with 2 steps to enter with L handrail, tub/shower w/ grab bar. Pt is getting married in August to her fiance. Pt was modified independent with mobility using a sight cane for her low vision and indep with basic ADL, using blister packs for medications, and requiring assist for transportation prior to admission. Pt endorses 2 falls in past 12 months. Pt currently presents with decreased strength, coordination, and sensation in LUE and LLE, which pt reports is improving since onset of symptoms. Demonstrated improvement in LUE strength from time of initial PT evaluation as well (see details below). Pt educated in simple UE exercises and use of LUE to maximize joint protection and functional use. Pt educated in s/s of stroke and smoking cessation to maximize self mgt of chronic disease and minimize risk of future health issues. Pt verbalized understanding. Pt will benefit from skilled OT Services to maximize safety and functional independence, including education/training in home routines/environmental modifications for low vision, BUE there-ex, energy conservation and self mgt of MS to minimize risk flares, stress mgt strategies to support MS, and home safety/falls prevention. Pt eager to return to jogging on treadmill. Recommend HHOT services upon discharge.      Follow Up Recommendations  Home health OT    Equipment Recommendations       Recommendations for Other Services       Precautions / Restrictions Precautions Precautions: Fall Restrictions Weight  Bearing Restrictions: No      Mobility Bed Mobility               General bed mobility comments: deferred, up in recliner  Transfers Overall transfer level: Needs assistance Equipment used: Rolling walker (2 wheeled) Transfers: Sit to/from Stand Sit to Stand: Min guard              Balance Overall balance assessment: Needs assistance Sitting-balance support: Feet supported Sitting balance-Leahy Scale: Good     Standing balance support: Bilateral upper extremity supported Standing balance-Leahy Scale: Poor                             ADL either performed or assessed with clinical judgement   ADL Overall ADL's : Needs assistance/impaired Eating/Feeding: Sitting;Modified independent   Grooming: Sitting;Modified independent   Upper Body Bathing: Sitting;Supervision/ safety;Set up   Lower Body Bathing: Sit to/from stand;Supervison/ safety;Set up   Upper Body Dressing : Sitting;Set up;Supervision/safety   Lower Body Dressing: Sit to/from stand;Supervision/safety;Set up   Toilet Transfer: RW;Min guard           Functional mobility during ADLs: Min guard;Rolling walker       Vision Baseline Vision/History: Legally blind Patient Visual Report: No change from baseline       Perception     Praxis      Pertinent Vitals/Pain Pain Assessment: No/denies pain     Hand Dominance Right   Extremity/Trunk Assessment Upper Extremity Assessment Upper Extremity Assessment: LUE deficits/detail LUE Deficits / Details: shoulder flexion 4/5, elbow flex/ext 4-/5, grip and pinch 3+/5; reports  tingling distal to elbow (reports improvement from this am)   Lower Extremity Assessment Lower Extremity Assessment: (MMT: L: grossly 4-/5, R: 4/5)   Cervical / Trunk Assessment Cervical / Trunk Assessment: Normal   Communication Communication Communication: No difficulties   Cognition Arousal/Alertness: Awake/alert Behavior During Therapy: WFL for tasks  assessed/performed Overall Cognitive Status: Within Functional Limits for tasks assessed                                     General Comments       Exercises Other Exercises Other Exercises: Pt educated in s/s of stroke and benefits to smoking cessation in regards to lowering her health risks and in support of self management of chronic disease. Pt verbalized understanding and expressed interest in quitting.   Shoulder Instructions      Home Living Family/patient expects to be discharged to:: Private residence Living Arrangements: Alone Available Help at Discharge: Family;Available PRN/intermittently(Has a fiance who works during the day and an aide who is available 1x/day) Type of Home: House Home Access: Stairs to enter [employer] of Steps: 1 at side (most used), 3 in front Entrance Stairs-Rails: Can reach both Home Layout: One level     Bathroom Shower/Tub: Chief Strategy Officer: Standard     Home Equipment: Cane - single point;Shower seat;Grab bars - tub/shower   Additional Comments: Pt has a walking stick due to being legally blind.      Prior Functioning/Environment Level of Independence: Needs assistance  Gait / Transfers Assistance Needed: pt modified independent with mobility using a sight cane for blindness, 2 falls in past 12 months ADL's / Homemaking Assistance Needed: requires assist for transportation, indep with basic ADL, family often cooks meals for pt but pt is able to perform light meal prep when she needs to            OT Problem List: Decreased strength;Decreased knowledge of use of DME or AE;Impaired vision/perception;Decreased coordination;Decreased activity tolerance;Impaired UE functional use;Impaired sensation;Impaired balance (sitting and/or standing)      OT Treatment/Interventions: Self-care/ADL training;Balance training;Therapeutic exercise;Therapeutic activities;DME and/or AE  instruction;Visual/perceptual remediation/compensation;Patient/family education;Energy conservation    OT Goals(Current goals can be found in the care plan section) Acute Rehab OT Goals Patient Stated Goal: go home and work with therapy so I can get back to jogging on the treadmill OT Goal Formulation: With patient Time For Goal Achievement: 05/30/18 Potential to Achieve Goals: Good ADL Goals Pt/caregiver will Perform Home Exercise Program: With written HEP provided;Increased strength;Left upper extremity(FMC and strengthening) Additional ADL Goal #1: Pt will independently implement learned joint/skin protection techniques during ADL and mobility tasks to maximize safety. Additional ADL Goal #2: Pt will verbalize understanding of signs and symptoms of a stroke to maximize safety.  OT Frequency: Min 2X/week   Barriers to D/C:            Co-evaluation              AM-PAC PT "6 Clicks" Daily Activity     Outcome Measure Help from another person eating meals?: None Help from another person taking care of personal grooming?: None Help from another person toileting, which includes using toliet, bedpan, or urinal?: A Little Help from another person bathing (including washing, rinsing, drying)?: A Little Help from another person to put on and taking off regular upper body clothing?: None Help from another person to put on and  taking off regular lower body clothing?: A Little 6 Click Score: 21   End of Session    Activity Tolerance: Patient tolerated treatment well Patient left: in chair;with call bell/phone within reach;with chair alarm set  OT Visit Diagnosis: Other abnormalities of gait and mobility (R26.89);Low vision, both eyes (H54.2);Hemiplegia and hemiparesis Hemiplegia - Right/Left: Left Hemiplegia - dominant/non-dominant: Non-Dominant Hemiplegia - caused by: Cerebral infarction                Time: 7829-5621 OT Time Calculation (min): 23 min Charges:  OT General  Charges $OT Visit: 1 Visit OT Evaluation $OT Eval Low Complexity: 1 Low OT Treatments $Self Care/Home Management : 8-22 mins   Richrd Prime, MPH, MS, OTR/L ascom [phone] 05/16/18, 4:16 PM

## 2018-05-17 LAB — ECHOCARDIOGRAM COMPLETE
Height: 60 in
WEIGHTICAEL: 1966.4 [oz_av]

## 2018-05-17 LAB — HIV ANTIBODY (ROUTINE TESTING W REFLEX): HIV Screen 4th Generation wRfx: NONREACTIVE

## 2018-05-17 MED ORDER — MELOXICAM 7.5 MG PO TABS
15.0000 mg | ORAL_TABLET | Freq: Every day | ORAL | Status: DC
  Filled 2018-05-17: qty 2

## 2018-05-17 MED ORDER — ASPIRIN 325 MG PO TABS: 325.0000 mg | ORAL_TABLET | Freq: Every day | ORAL | 0 refills | Status: DC

## 2018-05-17 MED ORDER — AMLODIPINE BESYLATE 2.5 MG PO TABS: 2.5000 mg | ORAL_TABLET | Freq: Every day | ORAL | 0 refills | Status: DC

## 2018-05-17 MED ORDER — NICOTINE 7 MG/24HR TD PT24: 7.0000 mg | MEDICATED_PATCH | Freq: Every day | TRANSDERMAL | 0 refills | Status: DC

## 2018-05-17 MED ORDER — ATORVASTATIN CALCIUM 40 MG PO TABS: 40.0000 mg | ORAL_TABLET | Freq: Every day | ORAL | 0 refills | Status: AC

## 2018-05-17 MED ORDER — MELOXICAM 15 MG PO TABS: 15.0000 mg | ORAL_TABLET | Freq: Every evening | ORAL | Status: DC | PRN

## 2018-05-17 MED ORDER — TRAZODONE HCL 150 MG PO TABS: 150.0000 mg | ORAL_TABLET | Freq: Every day | ORAL | 0 refills | Status: DC

## 2018-05-17 NOTE — Discharge Summary (Signed)
[name] at [facility] NAME: [name]    MR#:  [id number]  DATE OF BIRTH:  [birth year]  DATE OF ADMISSION:  05/15/2018 ADMITTING PHYSICIAN: Dustin Flock, MD  DATE OF DISCHARGE: 05/17/2018 12:15 PM  PRIMARY CARE PHYSICIAN: Shella Spearing, MD    ADMISSION DIAGNOSIS:  Cerebrovascular accident (CVA), unspecified mechanism ([place]) [I63.9]  DISCHARGE DIAGNOSIS:  Active Problems:   Left-sided weakness   CVA (cerebral vascular accident) ([place])   SECONDARY DIAGNOSIS:   Past Medical History:  Diagnosis Date  . Hypertension   . Legally blind   . Multiple sclerosis ([place]) 2001  . Multiple sclerosis ([place])   . Retinitis pigmentosa 2007    HOSPITAL COURSE:   1.  Acute CVA affecting the right pons with left-sided weakness.  Aspirin and atorvastatin ordered.  Echocardiogram negative.  Carotid ultrasound unremarkable.  Patient declined rehab and also home health. She states she wants to do outpatient physical therapy. 2.  Essential hypertension.  Blood pressure on the lower side at 1 in the morning.  I will discontinue lisinopril and hydrochlorothiazide.  Just do a lower dose Norvasc.  I held blood pressure medications today. 3.  History of MS continue usual medications 4.  Neuropathy on gabapentin 5.  Tobacco abuse.  Nicotine patch ordered  DISCHARGE CONDITIONS:   Satisfactory  CONSULTS OBTAINED:  Treatment Team:  Alexis Goodell, MD  DRUG ALLERGIES:   Allergies  Allergen Reactions  . Strawberry (Diagnostic) Swelling  . Carbamazepine Rash  . Carbamazepine Itching and Rash    DISCHARGE MEDICATIONS:   Allergies as of 05/17/2018      Reactions   Strawberry (diagnostic) Swelling   Carbamazepine Rash   Carbamazepine Itching, Rash      Medication List    STOP taking these medications   clonazePAM 0.5 MG tablet Commonly known as:  KLONOPIN   divalproex 250 MG DR tablet Commonly known as:  DEPAKOTE   hydrochlorothiazide 25 MG  tablet Commonly known as:  HYDRODIURIL   lisinopril 10 MG tablet Commonly known as:  PRINIVIL,ZESTRIL   modafinil 100 MG tablet Commonly known as:  PROVIGIL   oxyCODONE 5 MG immediate release tablet Commonly known as:  Oxy IR/ROXICODONE   tiZANidine 4 MG tablet Commonly known as:  ZANAFLEX     TAKE these medications   amLODipine 2.5 MG tablet Commonly known as:  NORVASC Take 1 tablet (2.5 mg total) by mouth daily. Start taking on:  05/18/2018 What changed:    medication strength  how much to take   aspirin 325 MG tablet Take 1 tablet (325 mg total) by mouth daily. Start taking on:  05/18/2018   atorvastatin 40 MG tablet Commonly known as:  LIPITOR Take 1 tablet (40 mg total) by mouth daily at 6 PM.   BETASERON 0.3 MG Kit injection Generic drug:  Interferon Beta-1b Inject 0.3 mg into the skin every other day.   docusate sodium 100 MG capsule Commonly known as:  COLACE Take 100 mg by mouth 2 (two) times daily.   gabapentin 300 MG capsule Commonly known as:  NEURONTIN Take 900 mg by mouth 3 (three) times daily.   meloxicam 15 MG tablet Commonly known as:  MOBIC Take 1 tablet (15 mg total) by mouth at bedtime as needed for pain. What changed:    when to take this  reasons to take this   methylphenidate 10 MG tablet Commonly known as:  RITALIN Take 10 mg by mouth daily.   nicotine  7 mg/24hr patch Commonly known as:  NICODERM CQ - dosed in mg/24 hr Place 1 patch (7 mg total) onto the skin daily. Okay to substitute generic patch What changed:  additional instructions   pantoprazole 40 MG tablet Commonly known as:  PROTONIX Take 40 mg by mouth daily.   potassium chloride 10 MEQ tablet Commonly known as:  K-DUR,KLOR-CON Take 1 tablet by mouth daily.   senna 8.6 MG Tabs tablet Commonly known as:  SENOKOT Take 8.6 mg by mouth 2 (two) times daily.   TECFIDERA 240 MG Cpdr Generic drug:  Dimethyl Fumarate Take 1 capsule by mouth 2 (two) times daily.    traZODone 150 MG tablet Commonly known as:  DESYREL Take 1 tablet (150 mg total) by mouth at bedtime. What changed:    medication strength  how much to take   vitamin E 1000 UNIT capsule Generic drug:  vitamin E Take 1,000 Units by mouth daily.            Durable Medical Equipment  (From admission, onward)        Start     Ordered   05/17/18 0847  For home use only DME Walker rolling  Once    Question:  Patient needs a walker to treat with the following condition  Answer:  CVA (cerebral vascular accident) ([place])   05/17/18 0846       DISCHARGE INSTRUCTIONS:   Follow-up PMD 6 days  If you experience worsening of your admission symptoms, develop shortness of breath, life threatening emergency, suicidal or homicidal thoughts you must seek medical attention immediately by calling 911 or calling your MD immediately  if symptoms less severe.  You Must read complete instructions/literature along with all the possible adverse reactions/side effects for all the Medicines you take and that have been prescribed to you. Take any new Medicines after you have completely understood and accept all the possible adverse reactions/side effects.   Please note  You were cared for by a hospitalist during your hospital stay. If you have any questions about your discharge medications or the care you received while you were in the hospital after you are discharged, you can call the unit and asked to speak with the hospitalist on call if the hospitalist that took care of you is not available. Once you are discharged, your primary care physician will handle any further medical issues. Please note that NO REFILLS for any discharge medications will be authorized once you are discharged, as it is imperative that you return to your primary care physician (or establish a relationship with a primary care physician if you do not have one) for your aftercare needs so that they can reassess your need for  medications and monitor your lab values.    Today   CHIEF COMPLAINT:   Chief Complaint  Patient presents with  . Weakness    HISTORY OF PRESENT ILLNESS:  [name]  is a 52 y.o. female presented with left-sided weakness   VITAL SIGNS:  Blood pressure 136/86, pulse 66, temperature 97.7 F (36.5 C), temperature source Oral, resp. rate 16, height 5' (1.524 m), weight 55.7 kg (122 lb 14.4 oz), SpO2 100 %.   PHYSICAL EXAMINATION:  GENERAL:  52 y.o.-year-old patient lying in the bed with no acute distress.  EYES: Pupils equal, round, reactive to light and accommodation. No scleral icterus. Extraocular muscles intact.  HEENT: Head atraumatic, normocephalic. Oropharynx and nasopharynx clear.  NECK:  Supple, no jugular venous distention. No thyroid enlargement,  no tenderness.  LUNGS: Normal breath sounds bilaterally, no wheezing, rales,rhonchi or crepitation. No use of accessory muscles of respiration.  CARDIOVASCULAR: S1, S2 normal. No murmurs, rubs, or gallops.  ABDOMEN: Soft, non-tender, non-distended. Bowel sounds present. No organomegaly or mass.  EXTREMITIES: No pedal edema, cyanosis, or clubbing.  NEUROLOGIC: Cranial nerves II through XII are intact.  Observed gait yesterday and she was able to maneuver with a walker with left-sided weakness.  Left-sided leg weakness has improved quite a bit and is able to maneuver.  Still with some left arm weakness and coordination difficulty. PSYCHIATRIC: The patient is alert and oriented x 3.  SKIN: No obvious rash, lesion, or ulcer.   DATA REVIEW:   CBC Recent Labs  Lab 05/15/18 1129  WBC 5.1  HGB 13.7  HCT 40.6  PLT 181    Chemistries  Recent Labs  Lab 05/15/18 1129  NA 141  K 3.3*  CL 102  CO2 30  GLUCOSE 94  BUN 14  CREATININE 0.49  CALCIUM 9.8  AST 28  ALT 20  ALKPHOS 63  BILITOT 0.7     RADIOLOGY:  Mr Brain Wo Contrast  Result Date: 05/15/2018 CLINICAL DATA:  Focal neuro deficit, stroke suspected. Numbness  and weakness in the left arm and left leg. EXAM: MRI HEAD WITHOUT CONTRAST TECHNIQUE: Multiplanar, multiecho pulse sequences of the brain and surrounding structures were obtained without intravenous contrast. COMPARISON:  01/17/2018 FINDINGS: Brain: 12 x 6 mm ovoid area of restricted diffusion in the right pons. There is FLAIR hyperintensity around the lateral ventricles with both confluent rim and discrete ovoid radiating pattern. Patient has history of multiple sclerosis. There have been remote micro hemorrhages in the left thalamus and subcortical right frontal lobe. Patient also has history of hypertension. No acute hemorrhage, hydrocephalus, or masslike finding Vascular: Major flow voids are preserved Skull and upper cervical spine: Negative for marrow lesion Sinuses/Orbits: Negative IMPRESSION: 12 x 7 mm focus of restricted diffusion in the right pons. The shape favors an acute infarct over active demyelination related to patient's multiple sclerosis. Electronically Signed   By: Monte Fantasia M.D.   On: 05/15/2018 14:36   US Carotid Bilateral (at [facility] Only)  Result Date: 05/15/2018 CLINICAL DATA:  Stroke. Left arm weakness and tingling for the past day. History of prior myocardial infarction and smoking. EXAM: BILATERAL CAROTID DUPLEX ULTRASOUND TECHNIQUE: Pearline Cables scale imaging, color Doppler and duplex ultrasound were performed of bilateral carotid and vertebral arteries in the neck. COMPARISON:  None. FINDINGS: Criteria: Quantification of carotid stenosis is based on velocity parameters that correlate the residual internal carotid diameter with NASCET-based stenosis levels, using the diameter of the distal internal carotid lumen as the denominator for stenosis measurement. The following velocity measurements were obtained: RIGHT ICA:  83/33 cm/sec CCA:  61/22 cm/sec SYSTOLIC ICA/CCA RATIO:  1.1 ECA:  74 cm/sec LEFT ICA:  53/9 cm/sec CCA:  44/97 cm/sec SYSTOLIC ICA/CCA RATIO:  0.8 ECA:  75 cm/sec  RIGHT CAROTID ARTERY: There is no grayscale evidence of significant intimal thickening or atherosclerotic plaque affecting the interrogated portions of the right carotid system. There are no elevated peak systolic velocities within the interrogated course of the right internal carotid artery to suggest a hemodynamically significant stenosis. RIGHT VERTEBRAL ARTERY:  Antegrade flow LEFT CAROTID ARTERY: There is no grayscale evidence of significant intimal thickening or atherosclerotic plaque affecting the interrogated portions of the left carotid system. There are no elevated peak systolic velocities within the interrogated course of  the left internal carotid artery to suggest a hemodynamically significant stenosis. LEFT VERTEBRAL ARTERY:  Antegrade Flow IMPRESSION: Normal carotid Doppler ultrasound. Electronically Signed   By: Sandi Mariscal M.D.   On: 05/15/2018 16:20   Mr Jodene Nam Head/brain YX Cm  Result Date: 05/15/2018 CLINICAL DATA:  Acute infarct.  History of multiple sclerosis. EXAM: MRA HEAD WITHOUT CONTRAST TECHNIQUE: Angiographic images of the Circle of Willis were obtained using MRA technique without intravenous contrast. COMPARISON:  Brain MRI 05/15/2018. FINDINGS: ANTERIOR CIRCULATION: --Intracranial internal carotid arteries: Normal. --Anterior cerebral arteries: Normal. Both A1 segments are present. Patent anterior communicating artery. --Middle cerebral arteries: Normal. --Posterior communicating arteries: Absent bilaterally. POSTERIOR CIRCULATION: --Basilar artery: Normal. --Posterior cerebral arteries: Normal. --Superior cerebellar arteries: Normal. --Inferior cerebellar arteries: Normal anterior and posterior inferior cerebellar arteries. IMPRESSION: Normal intracranial MRA. Electronically Signed   By: Ulyses Jarred M.D.   On: 05/15/2018 22:36    Management plans discussed with the patient, family and they are in agreement.  CODE STATUS:     Code Status Orders  (From admission, onward)         Start     Ordered   05/15/18 1441  Full code  Continuous     05/15/18 1440    Code Status History    Date Active Date Inactive Code Status Order ID Comments User Context   01/20/2017 1735 02/02/2017 1653 Full Code [id number]  Elizabeth Sauer Inpatient   01/20/2017 1735 01/20/2017 1735 Full Code [id number]  Cathlyn Parsons, PA-C Inpatient   01/16/2017 1647 01/20/2017 1719 Full Code [id number]  Judeth Horn, MD ED      TOTAL TIME TAKING CARE OF THIS PATIENT: 35 minutes.    Loletha Grayer M.D on 05/17/2018 at 2:23 PM  Between 7am to 6pm - Pager - [phone]  After 6pm go to [email or web address] - password [email or web address] [facility]  [phone]  CC: Primary care physician; Shella Spearing, MD

## 2018-05-17 NOTE — Progress Notes (Signed)
Physical Therapy Treatment Patient Details Name: [name] MRN: [id number] DOB: [birth year] Today's Date: 05/17/2018    History of Present Illness Patient is a 52 year old female admitted with CVA following c/o L side weakness.  Imaging is revealed a pontine infarct.  PMH includes multiple sclerosis, retinitis pigmentosa, legal blindness and Htn.    PT Comments    Pt demonstrated progress in ambulation, standing balance and transfers today.  Pt appears more steady on feet today during STS transfers and stance is more symmetrical.  She was able to complete 200 ft of ambulation with RW with close SBA and VC's for clearance of obstacles on two occasions.  Pt is able to correct independently once obstacle is brought to her attention.  Pt completed standing balance and strengthening there ex with use of counter and close SBA for form and proper alignment.  Pt reported no pain but that the "tingling" in her L LE increased with mobility.  PT provided education and time for questions concerning rehabilitation process and pt expressed that she is eager to participate.  Pt will continue to benefit from skilled PT with focus on regaining strength, balance, safe functional mobility and HEP.  Discharge recommendation has been updated to HHPT as a result of pt improvement in mobility and balance seen today.  Follow Up Recommendations  Home health PT     Equipment Recommendations  Rolling walker with 5" wheels    Recommendations for Other Services       Precautions / Restrictions Precautions Precautions: Fall Restrictions Weight Bearing Restrictions: No    Mobility  Bed Mobility Overal bed mobility: Independent                Transfers Overall transfer level: Needs assistance Equipment used: Rolling walker (2 wheeled) Transfers: Sit to/from Stand Sit to Stand: Supervision         General transfer comment: Able to perform STS safely with use of RW.  Ambulation/Gait Ambulation/Gait  assistance: Supervision Gait Distance (Feet): 200 Feet Assistive device: Rolling walker (2 wheeled)     Gait velocity interpretation: 1.31 - 2.62 ft/sec, indicative of limited community ambulator General Gait Details: Moderate foot clearance, good hip and knee flexion. Pt reports that tingling sensation in L LE increases with mobility.  No LOB's or gait deviations.  Pt does require VC's to clear obstacles on two occasions.   Stairs             Wheelchair Mobility    Modified Rankin (Stroke Patients Only)       Pensions consultant Exercises - Lower Extremity Mini-Sqauts: Strengthening;Both;Standing Other Exercises Other Exercises: Standing balance exercises: tandem: BLE: 2x10 sec with counter support and close SBA, tandem walking 2x10 ft with counter support and close SBA. Other Exercises: Standing heel raise x20, standing DF x20 with counter support and close SBA.    General Comments        Pertinent Vitals/Pain      Home Living  Prior Function            PT Goals (current goals can now be found in the care plan section) Acute Rehab PT Goals Patient Stated Goal: To return home and participate in rehab to recover quickly.  Also, to return to the gym and back to her normal exercise routine. PT Goal Formulation: With patient Time For Goal Achievement: 05/30/18 Potential to Achieve Goals: Good Progress towards PT goals: Progressing toward goals    Frequency    7X/week      PT Plan Discharge plan needs to be updated    Co-evaluation              AM-PAC PT "6 Clicks" Daily Activity  Outcome Measure  Difficulty turning over in bed (including adjusting bedclothes, sheets and blankets)?: None Difficulty moving from lying on back to sitting on the side of the bed? : A  Little Difficulty sitting down on and standing up from a chair with arms (e.g., wheelchair, bedside commode, etc,.)?: A Little Help needed moving to and from a bed to chair (including a wheelchair)?: A Little Help needed walking in hospital room?: A Little Help needed climbing 3-5 steps with a railing? : A Little 6 Click Score: 19    End of Session Equipment Utilized During Treatment: Gait belt Activity Tolerance: Patient tolerated treatment well Patient left: in bed;with bed alarm set Nurse Communication: Mobility status PT Visit Diagnosis: Unsteadiness on feet (R26.81);Muscle weakness (generalized) (M62.81)     Time: 0981-1914 PT Time Calculation (min) (ACUTE ONLY): 24 min  Charges:  $Therapeutic Exercise: 23-37 mins                    G Codes:  Functional Assessment Tool Used: AM-PAC 6 Clicks Basic Mobility    Glenetta Hew, PT, DPT    Glenetta Hew 05/17/2018, 9:23 AM

## 2018-05-17 NOTE — Care Management Note (Signed)
Case Management Note  Patient Details  Name: [name] MRN: [id number] Date of Birth: [birth year]  Subjective/Objective:    Admitted to [facility] with the diagnosis of left sided weakness. Lives alone. Father is [name] [phone]). Sees Dr. Betti Cruz in [place]. Sees Dr Sherryll Burger at [facility].  Inpatient Acute Rehabilitation at [facility] 2018. No skilled facility. No home oxygen. Shower chair and grab bars in the home. Nursing assistant in the home 1 hour a day per [facility]. Uses [employer] for transportation.                 Action/Plan: Wants to use outpatient rehabilitation offered at this facility. Dr. Renae Gloss signed referral-faxed down stairs. Ordered rolling walker per [facility]   Expected Discharge Date:  05/17/18               Expected Discharge Plan:     In-House Referral:     Discharge planning Services     Post Acute Care Choice:    Choice offered to:     DME Arranged:    DME Agency:     HH Arranged:    HH Agency:     Status of Service:     If discussed at [employer], dates discussed:    Additional Comments:  Gwenette Greet, RN MSN CCM Care Management [phone] 05/17/2018, 10:04 AM

## 2018-05-17 NOTE — Plan of Care (Signed)
MD making rounds. Order received to discharge home. IV removed. Prescriptions E-scribed for pt. Discharge paperwork provided, explained, signed and witnessed. No unanswered questions. Discharged via wheelchair with auxiliary staff. Belongings sent with patient and family.

## 2018-05-24 ENCOUNTER — Telehealth: Payer: Self-pay

## 2018-05-24 NOTE — Telephone Encounter (Signed)
EMMI Follow-up: It was noted on the report that the patient hadn't reviewed discharge papers yet and had several questions.  I talked with Ms. [name] and she had a chance to review her discharge paperwork now and wondered who to see if she had a change in condition since her PCP was in [place].  She was getting married next month and wondered if she could return to the gym as she was feeling better.  I recommended she talk with her PCP on that matter.  She said she would like to get established with a PCP in [facility] that accepts Medicaid and I gave her the number to the [facility] Physician Referral Service.  She thanked me for that information and no other needs noted today.

## 2018-06-05 ENCOUNTER — Encounter: Payer: Self-pay | Admitting: Occupational Therapy

## 2018-06-05 ENCOUNTER — Ambulatory Visit: Payer: Medicaid Other | Attending: Internal Medicine | Admitting: Occupational Therapy

## 2018-06-05 DIAGNOSIS — M6281 Muscle weakness (generalized): Secondary | ICD-10-CM | POA: Insufficient documentation

## 2018-06-05 DIAGNOSIS — R2681 Unsteadiness on feet: Secondary | ICD-10-CM | POA: Diagnosis present

## 2018-06-05 DIAGNOSIS — R262 Difficulty in walking, not elsewhere classified: Secondary | ICD-10-CM | POA: Insufficient documentation
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
## 2018-06-07 ENCOUNTER — Other Ambulatory Visit: Payer: Self-pay

## 2018-06-07 ENCOUNTER — Encounter: Payer: Self-pay | Admitting: Physical Therapy

## 2018-06-07 ENCOUNTER — Ambulatory Visit: Payer: Medicaid Other | Admitting: Physical Therapy

## 2018-06-07 VITALS — BP 149/82 | HR 71

## 2018-06-07 DIAGNOSIS — R2681 Unsteadiness on feet: Secondary | ICD-10-CM

## 2018-06-07 DIAGNOSIS — R262 Difficulty in walking, not elsewhere classified: Secondary | ICD-10-CM

## 2018-06-07 DIAGNOSIS — M6281 Muscle weakness (generalized): Secondary | ICD-10-CM | POA: Diagnosis not present

## 2018-06-07 NOTE — Patient Instructions (Signed)
SLS BLE 2x30 seconds, 7 days/wk, 2x/day
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
## 2018-06-14 ENCOUNTER — Ambulatory Visit: Payer: Medicaid Other | Admitting: Occupational Therapy

## 2018-06-14 ENCOUNTER — Ambulatory Visit: Payer: Medicaid Other | Admitting: Physical Therapy

## 2018-06-19 ENCOUNTER — Encounter: Payer: Self-pay | Admitting: Physical Therapy

## 2018-06-19 ENCOUNTER — Ambulatory Visit: Payer: Medicaid Other | Admitting: Physical Therapy

## 2018-06-19 ENCOUNTER — Ambulatory Visit: Payer: Medicaid Other | Admitting: Occupational Therapy

## 2018-06-19 DIAGNOSIS — M6281 Muscle weakness (generalized): Secondary | ICD-10-CM

## 2018-06-19 DIAGNOSIS — R262 Difficulty in walking, not elsewhere classified: Secondary | ICD-10-CM

## 2018-06-19 DIAGNOSIS — R2681 Unsteadiness on feet: Secondary | ICD-10-CM
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
## 2018-06-21 ENCOUNTER — Ambulatory Visit: Payer: Medicaid Other | Admitting: Physical Therapy

## 2018-06-21 ENCOUNTER — Ambulatory Visit: Payer: Medicaid Other | Admitting: Occupational Therapy

## 2018-07-17 ENCOUNTER — Ambulatory Visit: Payer: Medicaid Other | Attending: Internal Medicine | Admitting: Physical Therapy

## 2019-09-02 ENCOUNTER — Other Ambulatory Visit: Payer: Self-pay

## 2019-09-02 ENCOUNTER — Telehealth: Payer: Self-pay | Admitting: Family Medicine

## 2019-09-02 DIAGNOSIS — Z20822 Contact with and (suspected) exposure to covid-19: Secondary | ICD-10-CM

## 2019-09-02 NOTE — Telephone Encounter (Signed)
Pt called to inquire about getting tested for Covid 19 at [facility] testing site. Pt is visually impaired and does not have a way to go through site in vehicle. Pt is at [facility] on Monday's at 2pm and would like to know if someone wheels her over to the site in a wheelchair, would she be able to get tested. Please advise.

## 2019-09-02 NOTE — Telephone Encounter (Signed)
Phone call to the pt. To inquire about her situation.  Stated she will be at the [facility], in [place], for an appt.  Reported she spoke with an Mining engineer yesterday, that advised her, there will be Volunteers available, to assist her to get over to the Main Entrance at [facility], to the Testing site.  Stated she is asymptomatic, and wanted to be tested for COVID, due to exposure approx. 1.5 weeks ago.  Stated she will arrive at the Testing site at 2:00 PM, today.   Phone call to the RN Supervisor at the [employer], and made her aware of the pt's. Situation.  Stated she understood and would work out the details to accommodate the patient.   Called pt. and left vm that the Testing site has been made aware of her plan to arrive today, and will be expecting her.

## 2019-09-03 LAB — NOVEL CORONAVIRUS, NAA: SARS-CoV-2, NAA: NOT DETECTED

## 2019-09-09 ENCOUNTER — Telehealth: Payer: Self-pay | Admitting: General Practice

## 2019-09-09 NOTE — Telephone Encounter (Signed)
Negative COVID results given. Patient results "NOT Detected." Caller expressed understanding. ° °

## 2019-11-25 ENCOUNTER — Ambulatory Visit: Payer: Medicaid Other | Admitting: Speech Pathology

## 2019-11-27 ENCOUNTER — Encounter: Payer: Medicaid Other | Admitting: Speech Pathology

## 2019-12-02 ENCOUNTER — Encounter: Payer: Medicaid Other | Admitting: Speech Pathology

## 2019-12-04 ENCOUNTER — Encounter: Payer: Medicaid Other | Admitting: Speech Pathology

## 2019-12-09 ENCOUNTER — Encounter: Payer: Medicaid Other | Admitting: Speech Pathology

## 2019-12-11 ENCOUNTER — Encounter: Payer: Medicaid Other | Admitting: Speech Pathology

## 2019-12-16 ENCOUNTER — Encounter: Payer: Medicaid Other | Admitting: Speech Pathology

## 2019-12-18 ENCOUNTER — Encounter: Payer: Medicaid Other | Admitting: Speech Pathology

## 2019-12-23 ENCOUNTER — Encounter: Payer: Medicaid Other | Admitting: Speech Pathology

## 2019-12-25 ENCOUNTER — Encounter: Payer: Medicaid Other | Admitting: Speech Pathology

## 2019-12-30 ENCOUNTER — Encounter: Payer: Medicaid Other | Admitting: Speech Pathology

## 2020-01-01 ENCOUNTER — Encounter: Payer: Medicaid Other | Admitting: Speech Pathology

## 2020-02-06 ENCOUNTER — Ambulatory Visit: Payer: Medicaid Other | Attending: Internal Medicine

## 2020-02-06 DIAGNOSIS — Z23 Encounter for immunization: Secondary | ICD-10-CM

## 2020-02-06 NOTE — Progress Notes (Signed)
   Covid-19 Vaccination Clinic  Name:  [name]    MRN: [id number] DOB: 1966/04/15  02/06/2020  [name] was observed post Covid-19 immunization for 15 minutes without incident. She was provided with Vaccine Information Sheet and instruction to access the V-Safe system.   [name] was instructed to call 911 with any severe reactions post vaccine: Marland Kitchen Difficulty breathing  . Swelling of face and throat  . A fast heartbeat  . A bad rash all over body  . Dizziness and weakness   Immunizations Administered    Name Date Dose VIS Date Route   Pfizer COVID-19 Vaccine 02/06/2020  9:17 AM 0.3 mL 11/01/2019 Intramuscular   Manufacturer: [employer], [employer]   Lot: [id number]   NDC: [address]-4

## 2020-03-03 ENCOUNTER — Ambulatory Visit: Payer: Medicaid Other | Attending: Internal Medicine

## 2020-03-03 DIAGNOSIS — Z23 Encounter for immunization: Secondary | ICD-10-CM

## 2020-03-03 NOTE — Progress Notes (Signed)
   Covid-19 Vaccination Clinic  Name:  [name]    MRN: [id number] DOB: 08-10-1966  03/03/2020  [name] was observed post Covid-19 immunization for 30 minutes based on pre-vaccination screening without incident. She was provided with Vaccine Information Sheet and instruction to access the V-Safe system.   [name] was instructed to call 911 with any severe reactions post vaccine: Marland Kitchen Difficulty breathing  . Swelling of face and throat  . A fast heartbeat  . A bad rash all over body  . Dizziness and weakness   Immunizations Administered    Name Date Dose VIS Date Route   Pfizer COVID-19 Vaccine 03/03/2020 10:05 AM 0.3 mL 11/01/2019 Intramuscular   Manufacturer: [employer], [employer]   Lot: [id number]   NDC: [address]-5

## 2020-04-09 ENCOUNTER — Other Ambulatory Visit: Payer: Self-pay | Admitting: Neurology

## 2020-04-09 DIAGNOSIS — G35 Multiple sclerosis: Secondary | ICD-10-CM

## 2020-04-24 ENCOUNTER — Ambulatory Visit: Payer: Medicaid Other

## 2020-04-29 ENCOUNTER — Other Ambulatory Visit: Payer: Self-pay

## 2020-04-29 ENCOUNTER — Ambulatory Visit
Admission: RE | Admit: 2020-04-29 | Discharge: 2020-04-29 | Disposition: A | Discharge status: Home / Self Care | Payer: Medicaid Other | Source: Ambulatory Visit | Attending: Neurology | Admitting: Neurology

## 2020-04-29 DIAGNOSIS — G35 Multiple sclerosis: Secondary | ICD-10-CM | POA: Insufficient documentation

## 2020-04-29 MED ORDER — GADOBUTROL 1 MMOL/ML IV SOLN
5.0000 mL | Freq: Once | INTRAVENOUS | Status: AC | PRN
  Administered 2020-04-29: 5 mL via INTRAVENOUS

## 2020-05-13 ENCOUNTER — Ambulatory Visit: Payer: Medicaid Other | Attending: Internal Medicine

## 2020-05-13 DIAGNOSIS — G4733 Obstructive sleep apnea (adult) (pediatric): Secondary | ICD-10-CM | POA: Diagnosis not present

## 2020-05-14 ENCOUNTER — Other Ambulatory Visit: Payer: Self-pay

## 2020-05-27 ENCOUNTER — Ambulatory Visit: Payer: Medicaid Other | Attending: Family Medicine

## 2020-05-27 ENCOUNTER — Other Ambulatory Visit: Payer: Self-pay

## 2020-05-27 DIAGNOSIS — G8929 Other chronic pain: Secondary | ICD-10-CM | POA: Diagnosis present

## 2020-05-27 DIAGNOSIS — R2681 Unsteadiness on feet: Secondary | ICD-10-CM | POA: Insufficient documentation

## 2020-05-27 DIAGNOSIS — M25511 Pain in right shoulder: Secondary | ICD-10-CM | POA: Insufficient documentation

## 2020-05-27 DIAGNOSIS — M6281 Muscle weakness (generalized): Secondary | ICD-10-CM | POA: Diagnosis present

## 2020-05-27 DIAGNOSIS — R262 Difficulty in walking, not elsewhere classified: Secondary | ICD-10-CM | POA: Diagnosis present
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
## 2020-06-03 ENCOUNTER — Ambulatory Visit: Payer: Medicaid Other | Admitting: Physical Therapy

## 2020-06-03 ENCOUNTER — Encounter: Payer: Self-pay | Admitting: Physical Therapy

## 2020-06-03 ENCOUNTER — Other Ambulatory Visit: Payer: Self-pay

## 2020-06-03 DIAGNOSIS — M25511 Pain in right shoulder: Secondary | ICD-10-CM | POA: Diagnosis not present

## 2020-06-03 DIAGNOSIS — M6281 Muscle weakness (generalized): Secondary | ICD-10-CM

## 2020-06-03 DIAGNOSIS — R262 Difficulty in walking, not elsewhere classified: Secondary | ICD-10-CM

## 2020-06-03 DIAGNOSIS — R2681 Unsteadiness on feet: Secondary | ICD-10-CM
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
## 2020-06-08 ENCOUNTER — Ambulatory Visit: Payer: Medicaid Other

## 2020-06-10 ENCOUNTER — Ambulatory Visit: Payer: Medicaid Other | Admitting: Physical Therapy

## 2020-06-15 ENCOUNTER — Ambulatory Visit: Payer: Medicaid Other

## 2020-06-17 ENCOUNTER — Other Ambulatory Visit: Payer: Self-pay

## 2020-06-17 ENCOUNTER — Ambulatory Visit: Payer: Medicaid Other

## 2020-06-17 DIAGNOSIS — G8929 Other chronic pain: Secondary | ICD-10-CM

## 2020-06-17 DIAGNOSIS — M6281 Muscle weakness (generalized): Secondary | ICD-10-CM

## 2020-06-17 DIAGNOSIS — R2681 Unsteadiness on feet: Secondary | ICD-10-CM

## 2020-06-17 DIAGNOSIS — M25511 Pain in right shoulder: Secondary | ICD-10-CM | POA: Diagnosis not present

## 2020-06-17 DIAGNOSIS — R262 Difficulty in walking, not elsewhere classified: Secondary | ICD-10-CM
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
## 2020-06-22 ENCOUNTER — Ambulatory Visit: Payer: Medicaid Other | Attending: Family Medicine

## 2020-06-22 DIAGNOSIS — M6281 Muscle weakness (generalized): Secondary | ICD-10-CM | POA: Insufficient documentation

## 2020-06-22 DIAGNOSIS — R262 Difficulty in walking, not elsewhere classified: Secondary | ICD-10-CM | POA: Insufficient documentation

## 2020-06-22 DIAGNOSIS — M25511 Pain in right shoulder: Secondary | ICD-10-CM | POA: Insufficient documentation

## 2020-06-22 DIAGNOSIS — R2681 Unsteadiness on feet: Secondary | ICD-10-CM | POA: Insufficient documentation

## 2020-06-22 DIAGNOSIS — G8929 Other chronic pain: Secondary | ICD-10-CM | POA: Insufficient documentation

## 2020-06-24 ENCOUNTER — Other Ambulatory Visit: Payer: Self-pay

## 2020-06-24 ENCOUNTER — Ambulatory Visit: Payer: Medicaid Other

## 2020-06-24 DIAGNOSIS — M6281 Muscle weakness (generalized): Secondary | ICD-10-CM

## 2020-06-24 DIAGNOSIS — M25511 Pain in right shoulder: Secondary | ICD-10-CM

## 2020-06-24 DIAGNOSIS — R262 Difficulty in walking, not elsewhere classified: Secondary | ICD-10-CM

## 2020-06-24 DIAGNOSIS — R2681 Unsteadiness on feet: Secondary | ICD-10-CM | POA: Diagnosis present

## 2020-06-24 DIAGNOSIS — G8929 Other chronic pain: Secondary | ICD-10-CM | POA: Diagnosis present
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
## 2020-06-29 ENCOUNTER — Ambulatory Visit: Payer: Medicaid Other

## 2020-06-29 ENCOUNTER — Other Ambulatory Visit: Payer: Self-pay

## 2020-06-29 DIAGNOSIS — M6281 Muscle weakness (generalized): Secondary | ICD-10-CM

## 2020-06-29 DIAGNOSIS — M25511 Pain in right shoulder: Secondary | ICD-10-CM | POA: Diagnosis not present

## 2020-06-29 DIAGNOSIS — R2681 Unsteadiness on feet: Secondary | ICD-10-CM

## 2020-06-29 DIAGNOSIS — R262 Difficulty in walking, not elsewhere classified: Secondary | ICD-10-CM

## 2020-06-29 DIAGNOSIS — G8929 Other chronic pain: Secondary | ICD-10-CM
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
## 2020-07-01 ENCOUNTER — Other Ambulatory Visit: Payer: Self-pay

## 2020-07-01 ENCOUNTER — Other Ambulatory Visit
Admission: RE | Admit: 2020-07-01 | Discharge: 2020-07-01 | Disposition: A | Discharge status: Home / Self Care | Payer: Medicaid Other | Source: Ambulatory Visit | Attending: Gastroenterology | Admitting: Gastroenterology

## 2020-07-01 ENCOUNTER — Ambulatory Visit: Payer: Medicaid Other

## 2020-07-01 DIAGNOSIS — Z01812 Encounter for preprocedural laboratory examination: Secondary | ICD-10-CM | POA: Insufficient documentation

## 2020-07-01 DIAGNOSIS — Z20822 Contact with and (suspected) exposure to covid-19: Secondary | ICD-10-CM | POA: Diagnosis not present

## 2020-07-01 LAB — SARS CORONAVIRUS 2 (TAT 6-24 HRS): SARS Coronavirus 2: NEGATIVE

## 2020-07-02 ENCOUNTER — Encounter: Payer: Self-pay | Admitting: *Deleted

## 2020-07-03 ENCOUNTER — Encounter: Payer: Self-pay | Admitting: *Deleted

## 2020-07-03 ENCOUNTER — Encounter
Admission: RE | Disposition: A | Discharge status: Home / Self Care | Payer: Self-pay | Source: Home / Self Care | Attending: Gastroenterology

## 2020-07-03 ENCOUNTER — Ambulatory Visit: Payer: Medicaid Other | Admitting: Anesthesiology

## 2020-07-03 ENCOUNTER — Ambulatory Visit
Admission: RE | Admit: 2020-07-03 | Discharge: 2020-07-03 | Disposition: A | Discharge status: Home / Self Care | Payer: Medicaid Other | Attending: Gastroenterology | Admitting: Gastroenterology

## 2020-07-03 ENCOUNTER — Other Ambulatory Visit: Payer: Self-pay

## 2020-07-03 DIAGNOSIS — Z8673 Personal history of transient ischemic attack (TIA), and cerebral infarction without residual deficits: Secondary | ICD-10-CM | POA: Insufficient documentation

## 2020-07-03 DIAGNOSIS — K5730 Diverticulosis of large intestine without perforation or abscess without bleeding: Secondary | ICD-10-CM | POA: Diagnosis not present

## 2020-07-03 DIAGNOSIS — Z791 Long term (current) use of non-steroidal anti-inflammatories (NSAID): Secondary | ICD-10-CM | POA: Insufficient documentation

## 2020-07-03 DIAGNOSIS — Z79899 Other long term (current) drug therapy: Secondary | ICD-10-CM | POA: Diagnosis not present

## 2020-07-03 DIAGNOSIS — G35 Multiple sclerosis: Secondary | ICD-10-CM | POA: Diagnosis not present

## 2020-07-03 DIAGNOSIS — K5900 Constipation, unspecified: Secondary | ICD-10-CM | POA: Insufficient documentation

## 2020-07-03 DIAGNOSIS — H548 Legal blindness, as defined in USA: Secondary | ICD-10-CM | POA: Insufficient documentation

## 2020-07-03 DIAGNOSIS — I10 Essential (primary) hypertension: Secondary | ICD-10-CM | POA: Insufficient documentation

## 2020-07-03 DIAGNOSIS — Z7982 Long term (current) use of aspirin: Secondary | ICD-10-CM | POA: Diagnosis not present

## 2020-07-03 HISTORY — PX: COLONOSCOPY WITH PROPOFOL: SHX5780

## 2020-07-03 HISTORY — DX: Cerebral infarction, unspecified: I63.9

## 2020-07-03 SURGERY — COLONOSCOPY WITH PROPOFOL
Anesthesia: General

## 2020-07-03 MED ORDER — SODIUM CHLORIDE 0.9 % IV SOLN: INTRAVENOUS | Status: DC

## 2020-07-03 MED ORDER — PROPOFOL 10 MG/ML IV BOLUS
INTRAVENOUS | Status: DC | PRN
  Administered 2020-07-03: 50 mg via INTRAVENOUS

## 2020-07-03 MED ORDER — PROPOFOL 10 MG/ML IV BOLUS
INTRAVENOUS | Status: AC
  Filled 2020-07-03: qty 40

## 2020-07-03 MED ORDER — LIDOCAINE HCL (CARDIAC) PF 100 MG/5ML IV SOSY
PREFILLED_SYRINGE | INTRAVENOUS | Status: DC | PRN
  Administered 2020-07-03: 30 mg via INTRAVENOUS

## 2020-07-03 MED ORDER — PHENYLEPHRINE HCL (PRESSORS) 10 MG/ML IV SOLN
INTRAVENOUS | Status: DC | PRN
  Administered 2020-07-03: 50 ug via INTRAVENOUS

## 2020-07-03 MED ORDER — PROPOFOL 500 MG/50ML IV EMUL
INTRAVENOUS | Status: DC | PRN
  Administered 2020-07-03: 100 ug/kg/min via INTRAVENOUS

## 2020-07-03 NOTE — Anesthesia Postprocedure Evaluation (Signed)
Anesthesia Post Note  Patient: [name]  Procedure(s) Performed: COLONOSCOPY WITH PROPOFOL (N/A )  Patient location during evaluation: Endoscopy Anesthesia Type: General Level of consciousness: awake and alert and oriented Pain management: pain level controlled Vital Signs Assessment: post-procedure vital signs reviewed and stable Respiratory status: spontaneous breathing, nonlabored ventilation and respiratory function stable Cardiovascular status: blood pressure returned to baseline and stable Postop Assessment: no signs of nausea or vomiting Anesthetic complications: no   No complications documented.   Last Vitals:  Vitals:   07/03/20 0930 07/03/20 0940  BP: (!) 141/91 139/86  Pulse: 91 78  Resp: 15 15  Temp:    SpO2: 99% 100%    Last Pain:  Vitals:   07/03/20 0940  TempSrc:   PainSc: 0-No pain                 Ioma Jyllian Haynie

## 2020-07-03 NOTE — Interval H&P Note (Signed)
History and Physical Interval Note:  07/03/2020 8:40 AM  [name]  has presented today for surgery, with the diagnosis of CONSTIPATION.  The various methods of treatment have been discussed with the patient and family. After consideration of risks, benefits and other options for treatment, the patient has consented to  Procedure(s): COLONOSCOPY WITH PROPOFOL (N/A) as a surgical intervention.  The patient's history has been reviewed, patient examined, no change in status, stable for surgery.  I have reviewed the patient's chart and labs.  Questions were answered to the patient's satisfaction.     Regis Bill  Ok to proceed with colonoscopy.

## 2020-07-03 NOTE — Op Note (Signed)
[facility] Gastroenterology Patient Name: [name] Procedure Date: 07/03/2020 8:40 AM MRN: [id number] Account #: [id number] Date of Birth: 1965/11/27 Admit Type: Outpatient Age: 54 Room: [facility] ENDO ROOM 3 Gender: Female Note Status: Finalized Procedure:             Colonoscopy Indications:           Constipation Providers:             Andrey Farmer MD, MD Referring MD:          No Local Md, MD (Referring MD) Medicines:             Monitored Anesthesia Care Complications:         No immediate complications. Procedure:             Pre-Anesthesia Assessment:                        - Prior to the procedure, a History and Physical was                         performed, and patient medications and allergies were                         reviewed. The patient is competent. The risks and                         benefits of the procedure and the sedation options and                         risks were discussed with the patient. All questions                         were answered and informed consent was obtained.                         Patient identification and proposed procedure were                         verified by the physician, the nurse, the anesthetist                         and the technician in the endoscopy suite. Mental                         Status Examination: alert and oriented. Airway                         Examination: normal oropharyngeal airway and neck                         mobility. Respiratory Examination: clear to                         auscultation. CV Examination: normal. Prophylactic                         Antibiotics: The patient does not require prophylactic  antibiotics. Prior Anticoagulants: The patient has                         taken no previous anticoagulant or antiplatelet                         agents. ASA Grade Assessment: III - A patient with                         severe systemic disease. After  reviewing the risks and                         benefits, the patient was deemed in satisfactory                         condition to undergo the procedure. The anesthesia                         plan was to use monitored anesthesia care (MAC).                         Immediately prior to administration of medications,                         the patient was re-assessed for adequacy to receive                         sedatives. The heart rate, respiratory rate, oxygen                         saturations, blood pressure, adequacy of pulmonary                         ventilation, and response to care were monitored                         throughout the procedure. The physical status of the                         patient was re-assessed after the procedure.                        After obtaining informed consent, the colonoscope was                         passed under direct vision. Throughout the procedure,                         the patient's blood pressure, pulse, and oxygen                         saturations were monitored continuously. The                         Colonoscope was introduced through the anus and                         advanced to the the cecum, identified by appendiceal  orifice and ileocecal valve. The colonoscopy was                         performed without difficulty. The patient tolerated                         the procedure well. The quality of the bowel                         preparation was good. Findings:      The perianal and digital rectal examinations were normal.      A few small-mouthed diverticula were found in the sigmoid colon,       transverse colon and ascending colon.      The exam was otherwise without abnormality on direct and retroflexion       views. Impression:            - Diverticulosis in the sigmoid colon, in the                         transverse colon and in the ascending colon.                        - The  examination was otherwise normal on direct and                         retroflexion views.                        - No specimens collected. Recommendation:        - Discharge patient to home.                        - Resume previous diet.                        - Continue present medications.                        - Repeat colonoscopy in 10 years for screening                         purposes.                        - Return to referring physician as previously                         scheduled. Procedure Code(s):     --- Professional ---                        [phone], Colonoscopy, flexible; diagnostic, including                         collection of specimen(s) by brushing or washing, when                         performed (separate procedure) Diagnosis Code(s):     --- Professional ---  K59.00, Constipation, unspecified                        K57.30, Diverticulosis of large intestine without                         perforation or abscess without bleeding CPT copyright 2019 American Medical Association. All rights reserved. The codes documented in this report are preliminary and upon coder review may  be revised to meet current compliance requirements. Andrey Farmer, MD Andrey Farmer MD, MD 07/03/2020 9:08:33 AM Number of Addenda: 0 Note Initiated On: 07/03/2020 8:40 AM Scope Withdrawal Time: 0 hours 9 minutes 25 seconds  Total Procedure Duration: 0 hours 16 minutes 29 seconds  Estimated Blood Loss:  Estimated blood loss: none.      [facility]

## 2020-07-03 NOTE — H&P (Signed)
Outpatient short stay form Pre-procedure 07/03/2020 8:38 AM Raylene Miyamoto MD, MPH  Primary Physician: None listed  Reason for visit:  Constipation  History of present illness:   54 y/o lady with history of MS with worsening constipation here for colonoscopy. No family history of GI malignancies. No blood thinners. History of hysterectomy and cholecystectomy.    Current Facility-Administered Medications:  .  0.9 %  sodium chloride infusion, , Intravenous, Continuous, Seth Higginbotham, Hilton Cork, MD, Last Rate: 20 mL/hr at 07/03/20 0830, New Bag at 07/03/20 0830  Medications Prior to Admission  Medication Sig Dispense Refill Last Dose  . amLODipine (NORVASC) 2.5 MG tablet Take 1 tablet (2.5 mg total) by mouth daily. 30 tablet 0 07/02/2020 at Unknown time  . aspirin 325 MG tablet Take 1 tablet (325 mg total) by mouth daily. 30 tablet 0 07/02/2020 at Unknown time  . atorvastatin (LIPITOR) 40 MG tablet Take 1 tablet (40 mg total) by mouth daily at 6 PM. 30 tablet 0 07/02/2020 at Unknown time  . B Complex Vitamins (B-COMPLEX/B-12 PO) Take by mouth.   Past Week at Unknown time  . cholecalciferol (VITAMIN D3) 25 MCG (1000 UNIT) tablet Take 1,000 Units by mouth daily.   Past Week at Unknown time  . Dimethyl Fumarate (TECFIDERA) 240 MG CPDR Take 1 capsule by mouth 2 (two) times daily.   07/02/2020 at Unknown time  . gabapentin (NEURONTIN) 300 MG capsule Take 900 mg by mouth 3 (three) times daily.    07/02/2020 at Unknown time  . pantoprazole (PROTONIX) 40 MG tablet Take 40 mg by mouth daily.   07/02/2020 at Unknown time  . potassium chloride (K-DUR,KLOR-CON) 10 MEQ tablet Take 1 tablet by mouth daily.  1 07/02/2020 at Unknown time  . senna (SENOKOT) 8.6 MG TABS tablet Take 8.6 mg by mouth 2 (two) times daily.  11 Past Week at Unknown time  . docusate sodium (COLACE) 100 MG capsule Take 100 mg by mouth 2 (two) times daily.     . Interferon Beta-1b (BETASERON) 0.3 MG KIT injection Inject 0.3 mg into the skin  every other day. (Patient not taking: Reported on 07/03/2020)   Not Taking at Unknown time  . meloxicam (MOBIC) 15 MG tablet Take 1 tablet (15 mg total) by mouth at bedtime as needed for pain. (Patient not taking: Reported on 07/03/2020)   Not Taking at Unknown time  . methylphenidate (RITALIN) 10 MG tablet Take 10 mg by mouth daily. (Patient not taking: Reported on 07/03/2020)  0 Not Taking at Unknown time  . nicotine (NICODERM CQ - DOSED IN MG/24 HR) 7 mg/24hr patch Place 1 patch (7 mg total) onto the skin daily. Okay to substitute generic patch 28 patch 0   . traZODone (DESYREL) 150 MG tablet Take 1 tablet (150 mg total) by mouth at bedtime. (Patient not taking: Reported on 07/03/2020) 30 tablet 0 Not Taking at Unknown time  . vitamin E (VITAMIN E) 1000 UNIT capsule Take 1,000 Units by mouth daily. (Patient not taking: Reported on 07/03/2020)   Not Taking at Unknown time     Allergies  Allergen Reactions  . Strawberry (Diagnostic) Swelling  . Carbamazepine Rash  . Carbamazepine Itching and Rash     Past Medical History:  Diagnosis Date  . Hypertension   . Legally blind   . Multiple sclerosis ([place]) 2001  . Multiple sclerosis ([place])   . Retinitis pigmentosa 2007  . Stroke [facility])     Review of systems:  Otherwise negative.  Physical Exam  Gen: Alert, oriented. Appears stated age.  HEENT: [place]/AT. PERRLA. Lungs: No respiratory distress Abd: soft, benign, no masses. BS+ Ext: No edema. Pulses 2+    Planned procedures: Proceed with colonoscopy. The patient understands the nature of the planned procedure, indications, risks, alternatives and potential complications including but not limited to bleeding, infection, perforation, damage to internal organs and possible oversedation/side effects from anesthesia. The patient agrees and gives consent to proceed.  Please refer to procedure notes for findings, recommendations and patient disposition/instructions.     Raylene Miyamoto MD,  [facility] 07/03/2020  8:38 AM

## 2020-07-03 NOTE — Transfer of Care (Signed)
Immediate Anesthesia Transfer of Care Note  Patient: [name]  Procedure(s) Performed: COLONOSCOPY WITH PROPOFOL (N/A )  Patient Location: PACU  Anesthesia Type:General  Level of Consciousness: awake, alert  and oriented  Airway & Oxygen Therapy: Patient Spontanous Breathing  Post-op Assessment: Report given to RN and Post -op Vital signs reviewed and stable  Post vital signs: Reviewed and stable  Last Vitals:  Vitals Value Taken Time  BP    Temp    Pulse    Resp    SpO2      Last Pain:  Vitals:   07/03/20 0809  TempSrc: Temporal  PainSc: 0-No pain         Complications: No complications documented.

## 2020-07-03 NOTE — Anesthesia Preprocedure Evaluation (Signed)
Anesthesia Evaluation  Patient identified by MRN, date of birth, ID band Patient awake    Reviewed: Allergy & Precautions, NPO status , Patient's Chart, lab work & pertinent test results  History of Anesthesia Complications Negative for: history of anesthetic complications  Airway Mallampati: II  TM Distance: >3 FB Neck ROM: Full    Dental no notable dental hx.    Pulmonary neg sleep apnea, neg COPD, former smoker,    breath sounds clear to auscultation- rhonchi (-) wheezing      Cardiovascular hypertension, Pt. on medications + CAD and + Past MI  (-) Cardiac Stents and (-) CABG  Rhythm:Regular Rate:Normal - Systolic murmurs and - Diastolic murmurs    Neuro/Psych  Headaches, Anxiety CVA, No Residual Symptoms    GI/Hepatic negative GI ROS, Neg liver ROS,   Endo/Other  negative endocrine ROSneg diabetes  Renal/GU negative Renal ROS     Musculoskeletal negative musculoskeletal ROS (+)   Abdominal (+) + obese,   Peds  Hematology negative hematology ROS (+)   Anesthesia Other Findings Past Medical History: No date: Hypertension No date: Legally blind 2001: Multiple sclerosis (HCC) No date: Multiple sclerosis (HCC) 2007: Retinitis pigmentosa No date: Stroke [facility])   Reproductive/Obstetrics                             Anesthesia Physical Anesthesia Plan  ASA: III  Anesthesia Plan: General   Post-op Pain Management:    Induction: Intravenous  PONV Risk Score and Plan: 2 and Propofol infusion  Airway Management Planned: Natural Airway  Additional Equipment:   Intra-op Plan:   Post-operative Plan:   Informed Consent: I have reviewed the patients History and Physical, chart, labs and discussed the procedure including the risks, benefits and alternatives for the proposed anesthesia with the patient or authorized representative who has indicated his/her understanding and acceptance.      Dental advisory given  Plan Discussed with: CRNA and Anesthesiologist  Anesthesia Plan Comments:         Anesthesia Quick Evaluation

## 2020-07-06 ENCOUNTER — Other Ambulatory Visit: Payer: Self-pay

## 2020-07-06 ENCOUNTER — Encounter: Payer: Self-pay | Admitting: Gastroenterology

## 2020-07-06 ENCOUNTER — Ambulatory Visit: Payer: Medicaid Other

## 2020-07-06 DIAGNOSIS — M25511 Pain in right shoulder: Secondary | ICD-10-CM | POA: Diagnosis not present

## 2020-07-06 DIAGNOSIS — R2681 Unsteadiness on feet: Secondary | ICD-10-CM

## 2020-07-06 DIAGNOSIS — M6281 Muscle weakness (generalized): Secondary | ICD-10-CM

## 2020-07-06 DIAGNOSIS — G8929 Other chronic pain: Secondary | ICD-10-CM
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
## 2020-07-08 ENCOUNTER — Ambulatory Visit: Payer: Medicaid Other

## 2020-07-08 ENCOUNTER — Other Ambulatory Visit: Payer: Self-pay

## 2020-07-08 DIAGNOSIS — R2681 Unsteadiness on feet: Secondary | ICD-10-CM

## 2020-07-08 DIAGNOSIS — G8929 Other chronic pain: Secondary | ICD-10-CM

## 2020-07-08 DIAGNOSIS — M6281 Muscle weakness (generalized): Secondary | ICD-10-CM

## 2020-07-08 DIAGNOSIS — M25511 Pain in right shoulder: Secondary | ICD-10-CM | POA: Diagnosis not present

## 2020-07-08 DIAGNOSIS — R262 Difficulty in walking, not elsewhere classified: Secondary | ICD-10-CM
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
## 2020-07-13 ENCOUNTER — Other Ambulatory Visit: Payer: Self-pay

## 2020-07-13 ENCOUNTER — Ambulatory Visit: Payer: Medicaid Other

## 2020-07-13 DIAGNOSIS — M25511 Pain in right shoulder: Secondary | ICD-10-CM | POA: Diagnosis not present

## 2020-07-13 DIAGNOSIS — M6281 Muscle weakness (generalized): Secondary | ICD-10-CM

## 2020-07-13 DIAGNOSIS — G8929 Other chronic pain: Secondary | ICD-10-CM
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
## 2020-07-15 ENCOUNTER — Ambulatory Visit: Payer: Medicaid Other

## 2020-07-15 ENCOUNTER — Other Ambulatory Visit: Payer: Self-pay

## 2020-07-15 DIAGNOSIS — M6281 Muscle weakness (generalized): Secondary | ICD-10-CM

## 2020-07-15 DIAGNOSIS — M25511 Pain in right shoulder: Secondary | ICD-10-CM | POA: Diagnosis not present

## 2020-07-15 DIAGNOSIS — G8929 Other chronic pain: Secondary | ICD-10-CM
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
## 2020-07-20 ENCOUNTER — Other Ambulatory Visit: Payer: Self-pay

## 2020-07-20 ENCOUNTER — Ambulatory Visit: Payer: Medicaid Other | Admitting: Physical Therapy

## 2020-07-20 ENCOUNTER — Encounter: Payer: Self-pay | Admitting: Physical Therapy

## 2020-07-20 DIAGNOSIS — M6281 Muscle weakness (generalized): Secondary | ICD-10-CM

## 2020-07-20 DIAGNOSIS — R2681 Unsteadiness on feet: Secondary | ICD-10-CM

## 2020-07-20 DIAGNOSIS — M25511 Pain in right shoulder: Secondary | ICD-10-CM | POA: Diagnosis not present

## 2020-07-20 DIAGNOSIS — G8929 Other chronic pain: Secondary | ICD-10-CM

## 2020-07-20 DIAGNOSIS — R262 Difficulty in walking, not elsewhere classified: Secondary | ICD-10-CM
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
## 2020-07-22 ENCOUNTER — Other Ambulatory Visit: Payer: Self-pay

## 2020-07-22 ENCOUNTER — Ambulatory Visit: Payer: Medicaid Other | Attending: Family Medicine

## 2020-07-22 DIAGNOSIS — R2681 Unsteadiness on feet: Secondary | ICD-10-CM | POA: Diagnosis present

## 2020-07-22 DIAGNOSIS — M25511 Pain in right shoulder: Secondary | ICD-10-CM | POA: Diagnosis not present

## 2020-07-22 DIAGNOSIS — M6281 Muscle weakness (generalized): Secondary | ICD-10-CM | POA: Insufficient documentation

## 2020-07-22 DIAGNOSIS — R262 Difficulty in walking, not elsewhere classified: Secondary | ICD-10-CM | POA: Insufficient documentation

## 2020-07-22 DIAGNOSIS — G8929 Other chronic pain: Secondary | ICD-10-CM | POA: Insufficient documentation
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
## 2020-08-03 ENCOUNTER — Ambulatory Visit: Payer: Medicaid Other

## 2020-08-10 ENCOUNTER — Ambulatory Visit: Payer: Medicaid Other

## 2020-08-12 ENCOUNTER — Other Ambulatory Visit: Payer: Self-pay

## 2020-08-12 ENCOUNTER — Ambulatory Visit: Payer: Medicaid Other

## 2020-08-12 DIAGNOSIS — R262 Difficulty in walking, not elsewhere classified: Secondary | ICD-10-CM

## 2020-08-12 DIAGNOSIS — R2681 Unsteadiness on feet: Secondary | ICD-10-CM

## 2020-08-12 DIAGNOSIS — M25511 Pain in right shoulder: Secondary | ICD-10-CM | POA: Diagnosis not present

## 2020-08-12 DIAGNOSIS — M6281 Muscle weakness (generalized): Secondary | ICD-10-CM

## 2020-08-12 DIAGNOSIS — G8929 Other chronic pain: Secondary | ICD-10-CM
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
## 2020-08-17 ENCOUNTER — Other Ambulatory Visit: Payer: Self-pay

## 2020-08-17 ENCOUNTER — Ambulatory Visit: Payer: Medicaid Other

## 2020-08-17 DIAGNOSIS — M6281 Muscle weakness (generalized): Secondary | ICD-10-CM

## 2020-08-17 DIAGNOSIS — R2681 Unsteadiness on feet: Secondary | ICD-10-CM

## 2020-08-17 DIAGNOSIS — R262 Difficulty in walking, not elsewhere classified: Secondary | ICD-10-CM

## 2020-08-17 DIAGNOSIS — M25511 Pain in right shoulder: Secondary | ICD-10-CM | POA: Diagnosis not present
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
## 2020-08-19 ENCOUNTER — Other Ambulatory Visit: Payer: Self-pay

## 2020-08-19 ENCOUNTER — Ambulatory Visit: Payer: Medicaid Other

## 2020-08-19 DIAGNOSIS — G8929 Other chronic pain: Secondary | ICD-10-CM

## 2020-08-19 DIAGNOSIS — M6281 Muscle weakness (generalized): Secondary | ICD-10-CM

## 2020-08-19 DIAGNOSIS — M25511 Pain in right shoulder: Secondary | ICD-10-CM | POA: Diagnosis not present

## 2020-08-19 DIAGNOSIS — R2681 Unsteadiness on feet: Secondary | ICD-10-CM
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
## 2020-08-24 ENCOUNTER — Ambulatory Visit: Payer: Medicaid Other | Attending: Family Medicine | Admitting: Physical Therapy

## 2020-08-24 ENCOUNTER — Encounter: Payer: Self-pay | Admitting: Physical Therapy

## 2020-08-24 ENCOUNTER — Other Ambulatory Visit: Payer: Self-pay

## 2020-08-24 DIAGNOSIS — R2681 Unsteadiness on feet: Secondary | ICD-10-CM | POA: Diagnosis present

## 2020-08-24 DIAGNOSIS — G8929 Other chronic pain: Secondary | ICD-10-CM | POA: Insufficient documentation

## 2020-08-24 DIAGNOSIS — M6281 Muscle weakness (generalized): Secondary | ICD-10-CM | POA: Insufficient documentation

## 2020-08-24 DIAGNOSIS — R262 Difficulty in walking, not elsewhere classified: Secondary | ICD-10-CM | POA: Diagnosis present

## 2020-08-24 DIAGNOSIS — M25511 Pain in right shoulder: Secondary | ICD-10-CM | POA: Insufficient documentation
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
## 2020-08-26 ENCOUNTER — Other Ambulatory Visit: Payer: Self-pay

## 2020-08-26 ENCOUNTER — Ambulatory Visit: Payer: Medicaid Other

## 2020-08-26 DIAGNOSIS — M6281 Muscle weakness (generalized): Secondary | ICD-10-CM

## 2020-08-26 DIAGNOSIS — R2681 Unsteadiness on feet: Secondary | ICD-10-CM

## 2020-08-26 DIAGNOSIS — M25511 Pain in right shoulder: Secondary | ICD-10-CM
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
## 2020-09-08 ENCOUNTER — Encounter: Payer: Medicaid Other | Admitting: Physical Therapy

## 2020-09-09 ENCOUNTER — Ambulatory Visit: Payer: Medicaid Other

## 2020-09-09 ENCOUNTER — Other Ambulatory Visit: Payer: Self-pay

## 2020-09-09 DIAGNOSIS — G8929 Other chronic pain: Secondary | ICD-10-CM

## 2020-09-09 DIAGNOSIS — M25511 Pain in right shoulder: Secondary | ICD-10-CM

## 2020-09-09 DIAGNOSIS — M6281 Muscle weakness (generalized): Secondary | ICD-10-CM

## 2020-09-09 DIAGNOSIS — R2681 Unsteadiness on feet: Secondary | ICD-10-CM
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
## 2020-09-16 ENCOUNTER — Encounter: Payer: Self-pay | Admitting: Physical Therapy

## 2020-09-16 ENCOUNTER — Ambulatory Visit: Payer: Medicaid Other | Admitting: Physical Therapy

## 2020-09-16 ENCOUNTER — Other Ambulatory Visit: Payer: Self-pay

## 2020-09-16 DIAGNOSIS — G8929 Other chronic pain: Secondary | ICD-10-CM

## 2020-09-16 DIAGNOSIS — R2681 Unsteadiness on feet: Secondary | ICD-10-CM

## 2020-09-16 DIAGNOSIS — R262 Difficulty in walking, not elsewhere classified: Secondary | ICD-10-CM

## 2020-09-16 DIAGNOSIS — M25511 Pain in right shoulder: Secondary | ICD-10-CM

## 2020-09-16 DIAGNOSIS — M6281 Muscle weakness (generalized): Secondary | ICD-10-CM
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
## 2020-10-07 ENCOUNTER — Ambulatory Visit: Payer: Medicaid Other | Attending: Family Medicine | Admitting: Physical Therapy

## 2020-10-07 ENCOUNTER — Other Ambulatory Visit: Payer: Self-pay

## 2020-10-07 ENCOUNTER — Encounter: Payer: Self-pay | Admitting: Physical Therapy

## 2020-10-07 DIAGNOSIS — M25511 Pain in right shoulder: Secondary | ICD-10-CM | POA: Diagnosis present

## 2020-10-07 DIAGNOSIS — G8929 Other chronic pain: Secondary | ICD-10-CM | POA: Diagnosis present

## 2020-10-07 DIAGNOSIS — M6281 Muscle weakness (generalized): Secondary | ICD-10-CM | POA: Diagnosis present
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
## 2020-10-14 ENCOUNTER — Ambulatory Visit: Payer: Medicaid Other

## 2020-11-03 ENCOUNTER — Ambulatory Visit: Payer: Medicaid Other

## 2020-11-03 ENCOUNTER — Emergency Department: Payer: Medicaid Other

## 2020-11-03 ENCOUNTER — Inpatient Hospital Stay
Admission: EM | Admit: 2020-11-03 | Discharge: 2020-11-05 | DRG: 442 | Disposition: A | Discharge status: Home / Self Care | Payer: Medicaid Other | Attending: Internal Medicine | Admitting: Internal Medicine

## 2020-11-03 ENCOUNTER — Other Ambulatory Visit: Payer: Self-pay

## 2020-11-03 DIAGNOSIS — E669 Obesity, unspecified: Secondary | ICD-10-CM | POA: Diagnosis present

## 2020-11-03 DIAGNOSIS — E785 Hyperlipidemia, unspecified: Secondary | ICD-10-CM | POA: Diagnosis present

## 2020-11-03 DIAGNOSIS — H548 Legal blindness, as defined in USA: Secondary | ICD-10-CM | POA: Diagnosis present

## 2020-11-03 DIAGNOSIS — H3552 Pigmentary retinal dystrophy: Secondary | ICD-10-CM | POA: Diagnosis present

## 2020-11-03 DIAGNOSIS — R031 Nonspecific low blood-pressure reading: Secondary | ICD-10-CM | POA: Diagnosis present

## 2020-11-03 DIAGNOSIS — I071 Rheumatic tricuspid insufficiency: Secondary | ICD-10-CM | POA: Diagnosis present

## 2020-11-03 DIAGNOSIS — Z7982 Long term (current) use of aspirin: Secondary | ICD-10-CM

## 2020-11-03 DIAGNOSIS — I10 Essential (primary) hypertension: Secondary | ICD-10-CM | POA: Diagnosis not present

## 2020-11-03 DIAGNOSIS — N179 Acute kidney failure, unspecified: Secondary | ICD-10-CM | POA: Diagnosis present

## 2020-11-03 DIAGNOSIS — I4819 Other persistent atrial fibrillation: Secondary | ICD-10-CM | POA: Diagnosis present

## 2020-11-03 DIAGNOSIS — I480 Paroxysmal atrial fibrillation: Secondary | ICD-10-CM | POA: Diagnosis not present

## 2020-11-03 DIAGNOSIS — I119 Hypertensive heart disease without heart failure: Secondary | ICD-10-CM | POA: Diagnosis present

## 2020-11-03 DIAGNOSIS — F1411 Cocaine abuse, in remission: Secondary | ICD-10-CM | POA: Diagnosis present

## 2020-11-03 DIAGNOSIS — Z87891 Personal history of nicotine dependence: Secondary | ICD-10-CM

## 2020-11-03 DIAGNOSIS — K219 Gastro-esophageal reflux disease without esophagitis: Secondary | ICD-10-CM | POA: Diagnosis present

## 2020-11-03 DIAGNOSIS — Z90710 Acquired absence of both cervix and uterus: Secondary | ICD-10-CM

## 2020-11-03 DIAGNOSIS — Z20822 Contact with and (suspected) exposure to covid-19: Secondary | ICD-10-CM | POA: Diagnosis present

## 2020-11-03 DIAGNOSIS — D649 Anemia, unspecified: Secondary | ICD-10-CM | POA: Diagnosis present

## 2020-11-03 DIAGNOSIS — Z888 Allergy status to other drugs, medicaments and biological substances status: Secondary | ICD-10-CM

## 2020-11-03 DIAGNOSIS — G35 Multiple sclerosis: Secondary | ICD-10-CM | POA: Diagnosis present

## 2020-11-03 DIAGNOSIS — R55 Syncope and collapse: Secondary | ICD-10-CM | POA: Diagnosis present

## 2020-11-03 DIAGNOSIS — Z8719 Personal history of other diseases of the digestive system: Secondary | ICD-10-CM

## 2020-11-03 DIAGNOSIS — Z6832 Body mass index (BMI) 32.0-32.9, adult: Secondary | ICD-10-CM | POA: Diagnosis not present

## 2020-11-03 DIAGNOSIS — K760 Fatty (change of) liver, not elsewhere classified: Secondary | ICD-10-CM | POA: Diagnosis not present

## 2020-11-03 DIAGNOSIS — F1290 Cannabis use, unspecified, uncomplicated: Secondary | ICD-10-CM | POA: Diagnosis present

## 2020-11-03 DIAGNOSIS — R1012 Left upper quadrant pain: Secondary | ICD-10-CM | POA: Diagnosis not present

## 2020-11-03 DIAGNOSIS — I69354 Hemiplegia and hemiparesis following cerebral infarction affecting left non-dominant side: Secondary | ICD-10-CM | POA: Diagnosis not present

## 2020-11-03 DIAGNOSIS — I4892 Unspecified atrial flutter: Secondary | ICD-10-CM | POA: Diagnosis present

## 2020-11-03 DIAGNOSIS — K2980 Duodenitis without bleeding: Secondary | ICD-10-CM | POA: Diagnosis present

## 2020-11-03 DIAGNOSIS — Z7901 Long term (current) use of anticoagulants: Secondary | ICD-10-CM

## 2020-11-03 DIAGNOSIS — R42 Dizziness and giddiness: Secondary | ICD-10-CM | POA: Diagnosis not present

## 2020-11-03 DIAGNOSIS — F32A Depression, unspecified: Secondary | ICD-10-CM | POA: Diagnosis present

## 2020-11-03 DIAGNOSIS — Z79899 Other long term (current) drug therapy: Secondary | ICD-10-CM

## 2020-11-03 HISTORY — DX: Unspecified atrial fibrillation: I48.91

## 2020-11-03 LAB — URINALYSIS, COMPLETE (UACMP) WITH MICROSCOPIC
Bacteria, UA: NONE SEEN
Bilirubin Urine: NEGATIVE
Glucose, UA: NEGATIVE mg/dL
Hgb urine dipstick: NEGATIVE
Ketones, ur: NEGATIVE mg/dL
Leukocytes,Ua: NEGATIVE
Nitrite: NEGATIVE
Protein, ur: 100 mg/dL — AB
Specific Gravity, Urine: 1.028 (ref 1.005–1.030)
pH: 5 (ref 5.0–8.0)

## 2020-11-03 LAB — RESP PANEL BY RT-PCR (FLU A&B, COVID) ARPGX2
Influenza A by PCR: NEGATIVE
Influenza B by PCR: NEGATIVE
SARS Coronavirus 2 by RT PCR: NEGATIVE

## 2020-11-03 LAB — PROTIME-INR
INR: 2.2 — ABNORMAL HIGH (ref 0.8–1.2)
Prothrombin Time: 23.9 seconds — ABNORMAL HIGH (ref 11.4–15.2)

## 2020-11-03 LAB — TROPONIN I (HIGH SENSITIVITY): Troponin I (High Sensitivity): 11 ng/L (ref <18)

## 2020-11-03 LAB — BASIC METABOLIC PANEL
Anion gap: 9 (ref 5–15)
BUN: 27 mg/dL — ABNORMAL HIGH (ref 6–20)
CO2: 24 mmol/L (ref 22–32)
Calcium: 9 mg/dL (ref 8.9–10.3)
Chloride: 108 mmol/L (ref 98–111)
Creatinine, Ser: 1.2 mg/dL — ABNORMAL HIGH (ref 0.44–1.00)
GFR, Estimated: 53 mL/min — ABNORMAL LOW (ref >60)
Glucose, Bld: 107 mg/dL — ABNORMAL HIGH (ref 70–99)
Potassium: 4.1 mmol/L (ref 3.5–5.1)
Sodium: 141 mmol/L (ref 135–145)

## 2020-11-03 LAB — HEPATIC FUNCTION PANEL
ALT: 49 U/L — ABNORMAL HIGH (ref 0–44)
AST: 41 U/L (ref 15–41)
Albumin: 3.8 g/dL (ref 3.5–5.0)
Alkaline Phosphatase: 93 U/L (ref 38–126)
Bilirubin, Direct: 0.1 mg/dL (ref 0.0–0.2)
Indirect Bilirubin: 0.8 mg/dL (ref 0.3–0.9)
Total Bilirubin: 0.9 mg/dL (ref 0.3–1.2)
Total Protein: 7.3 g/dL (ref 6.5–8.1)

## 2020-11-03 LAB — CBC
HCT: 35.4 % — ABNORMAL LOW (ref 36.0–46.0)
Hemoglobin: 11.7 g/dL — ABNORMAL LOW (ref 12.0–15.0)
MCH: 29.5 pg (ref 26.0–34.0)
MCHC: 33.1 g/dL (ref 30.0–36.0)
MCV: 89.4 fL (ref 80.0–100.0)
Platelets: 193 10*3/uL (ref 150–400)
RBC: 3.96 MIL/uL (ref 3.87–5.11)
RDW: 14.9 % (ref 11.5–15.5)
WBC: 6.9 10*3/uL (ref 4.0–10.5)
nRBC: 0 % (ref 0.0–0.2)

## 2020-11-03 LAB — LACTIC ACID, PLASMA: Lactic Acid, Venous: 0.7 mmol/L (ref 0.5–1.9)

## 2020-11-03 LAB — TSH: TSH: 3.588 u[IU]/mL (ref 0.350–4.500)

## 2020-11-03 LAB — LIPASE, BLOOD: Lipase: 31 U/L (ref 11–51)

## 2020-11-03 MED ORDER — ALUM & MAG HYDROXIDE-SIMETH 200-200-20 MG/5ML PO SUSP: 30.0000 mL | Freq: Four times a day (QID) | ORAL | Status: DC | PRN

## 2020-11-03 MED ORDER — SENNOSIDES-DOCUSATE SODIUM 8.6-50 MG PO TABS: 1.0000 | ORAL_TABLET | Freq: Every evening | ORAL | Status: DC | PRN

## 2020-11-03 MED ORDER — ZOLPIDEM TARTRATE 5 MG PO TABS: 5.0000 mg | ORAL_TABLET | Freq: Every evening | ORAL | Status: DC | PRN

## 2020-11-03 MED ORDER — ASPIRIN 325 MG PO TABS
325.0000 mg | ORAL_TABLET | Freq: Every day | ORAL | Status: DC
Start: 2020-11-04 — End: 2020-11-04
  Filled 2020-11-03: qty 1

## 2020-11-03 MED ORDER — DOCUSATE SODIUM 100 MG PO CAPS
100.0000 mg | ORAL_CAPSULE | Freq: Two times a day (BID) | ORAL | Status: DC
  Administered 2020-11-03 – 2020-11-05 (×3): 100 mg via ORAL
  Filled 2020-11-03 (×4): qty 1

## 2020-11-03 MED ORDER — BISACODYL 5 MG PO TBEC: 5.0000 mg | DELAYED_RELEASE_TABLET | Freq: Every day | ORAL | Status: DC | PRN

## 2020-11-03 MED ORDER — MAGNESIUM CITRATE PO SOLN
1.0000 | Freq: Once | ORAL | Status: DC | PRN
  Filled 2020-11-03: qty 296

## 2020-11-03 MED ORDER — SODIUM CHLORIDE 0.9 % IV SOLN: INTRAVENOUS | Status: DC

## 2020-11-03 MED ORDER — PANTOPRAZOLE SODIUM 40 MG IV SOLR
40.0000 mg | INTRAVENOUS | Status: DC
  Administered 2020-11-03 – 2020-11-04 (×2): 40 mg via INTRAVENOUS
  Filled 2020-11-03 (×2): qty 40

## 2020-11-03 MED ORDER — SENNA 8.6 MG PO TABS
8.6000 mg | ORAL_TABLET | Freq: Two times a day (BID) | ORAL | Status: DC
  Administered 2020-11-03 – 2020-11-05 (×3): 8.6 mg via ORAL
  Filled 2020-11-03 (×4): qty 1

## 2020-11-03 MED ORDER — VITAMIN D 25 MCG (1000 UNIT) PO TABS
1000.0000 [IU] | ORAL_TABLET | Freq: Every day | ORAL | Status: DC
  Administered 2020-11-04 – 2020-11-05 (×2): 1000 [IU] via ORAL
  Filled 2020-11-03 (×2): qty 1

## 2020-11-03 MED ORDER — ONDANSETRON HCL 4 MG PO TABS: 4.0000 mg | ORAL_TABLET | Freq: Four times a day (QID) | ORAL | Status: DC | PRN

## 2020-11-03 MED ORDER — ACETAMINOPHEN 325 MG PO TABS: 650.0000 mg | ORAL_TABLET | Freq: Four times a day (QID) | ORAL | Status: DC | PRN

## 2020-11-03 MED ORDER — POTASSIUM CHLORIDE CRYS ER 10 MEQ PO TBCR
10.0000 meq | EXTENDED_RELEASE_TABLET | Freq: Every day | ORAL | Status: DC
  Administered 2020-11-04 – 2020-11-05 (×2): 10 meq via ORAL
  Filled 2020-11-03 (×2): qty 1

## 2020-11-03 MED ORDER — IOHEXOL 300 MG/ML  SOLN
100.0000 mL | Freq: Once | INTRAMUSCULAR | Status: AC | PRN
  Administered 2020-11-03: 17:00:00 100 mL via INTRAVENOUS

## 2020-11-03 MED ORDER — ATORVASTATIN CALCIUM 20 MG PO TABS
40.0000 mg | ORAL_TABLET | Freq: Every day | ORAL | Status: DC
  Administered 2020-11-04: 18:00:00 40 mg via ORAL
  Filled 2020-11-03: qty 2

## 2020-11-03 MED ORDER — ONDANSETRON HCL 4 MG/2ML IJ SOLN: 4.0000 mg | Freq: Four times a day (QID) | INTRAMUSCULAR | Status: DC | PRN

## 2020-11-03 MED ORDER — HYDROCODONE-ACETAMINOPHEN 5-325 MG PO TABS: 1.0000 | ORAL_TABLET | ORAL | Status: DC | PRN

## 2020-11-03 MED ORDER — NICOTINE 7 MG/24HR TD PT24
7.0000 mg | MEDICATED_PATCH | Freq: Every day | TRANSDERMAL | Status: DC
  Filled 2020-11-03 (×2): qty 1

## 2020-11-03 MED ORDER — GABAPENTIN 300 MG PO CAPS
900.0000 mg | ORAL_CAPSULE | Freq: Three times a day (TID) | ORAL | Status: DC
  Administered 2020-11-03 – 2020-11-05 (×5): 900 mg via ORAL
  Filled 2020-11-03 (×5): qty 3

## 2020-11-03 MED ORDER — ACETAMINOPHEN 650 MG RE SUPP: 650.0000 mg | Freq: Four times a day (QID) | RECTAL | Status: DC | PRN

## 2020-11-03 MED ORDER — DIMETHYL FUMARATE 240 MG PO CPDR: 1.0000 | DELAYED_RELEASE_CAPSULE | Freq: Two times a day (BID) | ORAL | Status: DC

## 2020-11-03 MED ORDER — IOHEXOL 9 MG/ML PO SOLN
1000.0000 mL | Freq: Once | ORAL | Status: DC | PRN
  Filled 2020-11-03: qty 1000

## 2020-11-03 MED ORDER — SODIUM CHLORIDE 0.9% FLUSH
3.0000 mL | Freq: Two times a day (BID) | INTRAVENOUS | Status: DC
  Administered 2020-11-03 – 2020-11-05 (×4): 3 mL via INTRAVENOUS

## 2020-11-03 MED ORDER — AMLODIPINE BESYLATE 5 MG PO TABS
2.5000 mg | ORAL_TABLET | Freq: Every day | ORAL | Status: DC
  Administered 2020-11-04 – 2020-11-05 (×2): 2.5 mg via ORAL
  Filled 2020-11-03 (×2): qty 1

## 2020-11-03 NOTE — ED Triage Notes (Signed)
Pt states over the past 2 weeks that every time she stands up she feels like she is going to pass out. States she feels fine while she is sitting still but if she gets up to walk she becomes very dizzy.[name]

## 2020-11-03 NOTE — ED Notes (Signed)
1345: 135/82 HR 77 1348: 137/84  HR 81 1351: 141/87 HR 82

## 2020-11-03 NOTE — H&P (Signed)
History and Physical   [facility] [place] @ [facility] Admission History and Physical [employer], D.O.    Patient Name: Rolande Moe MR#: [id number] Date of Birth: 1966-09-30 Date of Admission: 11/03/2020  Referring MD/NP/PA: Dr. Cinda Quest Primary Care Physician: Martyn Malay, DO  Chief Complaint:  Chief Complaint  Patient presents with  . Dizziness  . Near Syncope    HPI: Valbona Gohr is a 54 y.o. female with a known history of afib, HTN, MS, CVA with left sided weakness presents to the emergency department for evaluation of dizziness, near syncope and upper abdominal pain. .  Patient was in a usual state of health until one week ago she reports the onset of upper abdominal pain associated with dizziness and lightheadedness.  She has had these symptoms in the past and they have recurred.  .  Of note patient had a normal cardiac workup within the past month.    Patient denies fevers/chills,  chest pain, shortness of breath, N/V/C/D,  dysuria/frequency, changes in mental status.    Otherwise there has been no change in status. Patient has been taking medication as prescribed and there has been no recent change in medication or diet.  No recent antibiotics.  There has been no recent illness, hospitalizations, travel or sick contacts.    EMS/ED Course:Medical admission has been requested for further management of abdominal pain ?mesenteric ischemia, near syncope.  Review of Systems:  CONSTITUTIONAL: Positive dizziness, weakness. No fever/chills, fatigue, weight gain/loss, headache. EYES: No blurry or double vision. ENT: No tinnitus, postnasal drip, redness or soreness of the oropharynx. RESPIRATORY: No cough, dyspnea, wheeze.  No hemoptysis.  CARDIOVASCULAR: No chest pain, palpitations, syncope, orthopnea. No lower extremity edema.  GASTROINTESTINAL: Positive abdominal pain. No nausea, vomiting, diarrhea, constipation.  No hematemesis, melena or  hematochezia. GENITOURINARY: No dysuria, frequency, hematuria. ENDOCRINE: No polyuria or nocturia. No heat or cold intolerance. HEMATOLOGY: No anemia, bruising, bleeding. INTEGUMENTARY: No rashes, ulcers, lesions. MUSCULOSKELETAL: No arthritis, gout, dyspnea. NEUROLOGIC: No numbness, tingling, ataxia, seizure-type activity PSYCHIATRIC: No anxiety, depression, insomnia.   Past Medical History:  Diagnosis Date  . A-fib ([place])   . Hypertension   . Legally blind   . Multiple sclerosis ([place]) 2001  . Multiple sclerosis ([place])   . Retinitis pigmentosa 2007  . Stroke [facility])     Past Surgical History:  Procedure Laterality Date  . ABDOMINAL HYSTERECTOMY    . COLONOSCOPY WITH PROPOFOL N/A 07/03/2020   Procedure: COLONOSCOPY WITH PROPOFOL;  Surgeon: Lesly Rubenstein, MD;  Location: [facility] ENDOSCOPY;  Service: Endoscopy;  Laterality: N/A;  . CYST REMOVAL NECK     spine     reports that she quit smoking about 2 years ago. Her smoking use included cigarettes. She has a 9.90 pack-year smoking history. She has never used smokeless tobacco. She reports that she does not drink alcohol and does not use drugs.  Allergies  Allergen Reactions  . Strawberry (Diagn[facility]) Swelling  . Carbamazepine Rash  . Carbamazepine Itching and Rash    No family history on file.  Prior to Admission medications   Medication Sig Start Date End Date Taking? Authorizing Provider  amLODipine (NORVASC) 2.5 MG tablet Take 1 tablet (2.5 mg total) by mouth daily. 05/18/18   Loletha Grayer, MD  aspirin 325 MG tablet Take 1 tablet (325 mg total) by mouth daily. 05/18/18   Loletha Grayer, MD  atorvastatin (LIPITOR) 40 MG tablet Take 1 tablet (40 mg total) by mouth daily at 6 PM. 05/17/18  Loletha Grayer, MD  B Complex Vitamins (B-COMPLEX/B-12 PO) Take by mouth.    [provider]  cholecalciferol (VITAMIN D3) 25 MCG (1000 UNIT) tablet Take 1,000 Units by mouth daily.    [provider]  Dimethyl  Fumarate (TECFIDERA) 240 MG CPDR Take 1 capsule by mouth 2 (two) times daily.    [provider]  docusate sodium (COLACE) 100 MG capsule Take 100 mg by mouth 2 (two) times daily.    [provider]  gabapentin (NEURONTIN) 300 MG capsule Take 900 mg by mouth 3 (three) times daily.     [provider]  Interferon Beta-1b (BETASERON) 0.3 MG KIT injection Inject 0.3 mg into the skin every other day. Patient not taking: Reported on 07/03/2020 11/25/15   [provider]  meloxicam (MOBIC) 15 MG tablet Take 1 tablet (15 mg total) by mouth at bedtime as needed for pain. Patient not taking: Reported on 07/03/2020 05/17/18   Loletha Grayer, MD  methylphenidate (RITALIN) 10 MG tablet Take 10 mg by mouth daily. Patient not taking: Reported on 07/03/2020 05/02/18   [provider]  nicotine (NICODERM CQ - DOSED IN MG/24 HR) 7 mg/24hr patch Place 1 patch (7 mg total) onto the skin daily. Okay to substitute generic patch 05/17/18   Loletha Grayer, MD  pantoprazole (PROTONIX) 40 MG tablet Take 40 mg by mouth daily.    [provider]  potassium chloride (K-DUR,KLOR-CON) 10 MEQ tablet Take 1 tablet by mouth daily. 04/12/18   [provider]  senna (SENOKOT) 8.6 MG TABS tablet Take 8.6 mg by mouth 2 (two) times daily. 05/14/18   [provider]  traZODone (DESYREL) 150 MG tablet Take 1 tablet (150 mg total) by mouth at bedtime. Patient not taking: Reported on 07/03/2020 05/17/18   Loletha Grayer, MD  vitamin E (VITAMIN E) 1000 UNIT capsule Take 1,000 Units by mouth daily. Patient not taking: Reported on 07/03/2020    [provider]    Physical Exam: Vitals:   11/03/20 1122 11/03/20 1126 11/03/20 1445 11/03/20 1823  BP: (!) 131/106  (!) 138/92 (!) 142/86  Pulse: 66  75 84  Resp: '18  17 20  ' Temp:  98.6 F (37 C)  98.5 F (36.9 C)  TempSrc: Oral Oral  Oral  SpO2: 98%  99% 99%  Weight: 74.8 kg     Height: 5' (1.524 m)        GENERAL: 54 y.o.-year-old black female patient, well-developed, well-nourished lying in the bed in no acute distress.  Pleasant and cooperative.   HEENT: Head atraumatic, normocephalic. Pupils equal. Mucus membranes moist. NECK: Supple. No JVD. CHEST: Normal breath sounds bilaterally. No wheezing, rales, rhonchi or crackles. No use of accessory muscles of respiration.  No reproducible chest wall tenderness.  CARDIOVASCULAR: S1, S2 normal. No murmurs, rubs, or gallops. Cap refill <2 seconds. Pulses intact distally.  ABDOMEN: Soft, nondistended, upper and midepigastric TTP.  No rebound, guarding, rigidity. Normoactive bowel sounds present in all four quadrants.  EXTREMITIES: No pedal edema, cyanosis, or clubbing. No calf tenderness or Homan's sign.  NEUROLOGIC: The patient is alert and oriented x 3.  PSYCHIATRIC:  Normal affect, mood, thought content. SKIN: Warm, dry, and intact without obvious rash, lesion, or ulcer.    Labs on Admission:  CBC: Recent Labs  Lab 11/03/20 1126  WBC 6.9  HGB 11.7*  HCT 35.4*  MCV 89.4  PLT 458   Basic Metabolic Panel: Recent Labs  Lab 11/03/20 1126  NA 141  K 4.1  CL 108  CO2 24  GLUCOSE 107*  BUN 27*  CREATININE 1.20*  CALCIUM 9.0   GFR: Estimated Creatinine Clearance: 47.3 mL/min (A) (by C-G formula based on SCr of 1.2 mg/dL (H)). Liver Function Tests: Recent Labs  Lab 11/03/20 1126  AST 41  ALT 49*  ALKPHOS 93  BILITOT 0.9  PROT 7.3  ALBUMIN 3.8   Recent Labs  Lab 11/03/20 1126  LIPASE 31   No results for input(s): AMMONIA in the last 168 hours. Coagulation Profile: Recent Labs  Lab 11/03/20 1126  INR 2.2*   Cardiac Enzymes: No results for input(s): CKTOTAL, CKMB, CKMBINDEX, TROPONINI in the last 168 hours. BNP (last 3 results) No results for input(s): PROBNP in the last 8760 hours. HbA1C: No results for input(s): HGBA1C in the last 72 hours. CBG: No results for input(s): GLUCAP in the last 168 hours. Lipid  Profile: No results for input(s): CHOL, HDL, LDLCALC, TRIG, CHOLHDL, LDLDIRECT in the last 72 hours. Thyroid Function Tests: Recent Labs    11/03/20 1126  TSH 3.588   Anemia Panel: No results for input(s): VITAMINB12, FOLATE, FERRITIN, TIBC, IRON, RETICCTPCT in the last 72 hours. Urine analysis:    Component Value Date/Time   COLORURINE AMBER (A) 11/03/2020 1055   APPEARANCEUR HAZY (A) 11/03/2020 1055   LABSPEC 1.028 11/03/2020 1055   PHURINE 5.0 11/03/2020 1055   GLUCOSEU NEGATIVE 11/03/2020 1055   HGBUR NEGATIVE 11/03/2020 1055   BILIRUBINUR NEGATIVE 11/03/2020 1055   KETONESUR NEGATIVE 11/03/2020 1055   PROTEINUR 100 (A) 11/03/2020 1055   NITRITE NEGATIVE 11/03/2020 1055   LEUKOCYTESUR NEGATIVE 11/03/2020 1055   Sepsis Labs: '@LABRCNTIP' (procalcitonin:4,lacticidven:4) )No results found for this or any previous visit (from the past 240 hour(s)).   Radiological Exams on Admission: DG Chest 2 View  Result Date: 11/03/2020 CLINICAL DATA:  Dizziness EXAM: CHEST - 2 VIEW COMPARISON:  05/15/2018 FINDINGS: Small right-sided pleural effusion. No consolidation. Probable atelectasis right base. Mild cardiomegaly. No pneumothorax. IMPRESSION: Small right-sided pleural effusion with probable atelectasis at the right base. Mild cardiomegaly. Electronically Signed   By: Donavan Foil M.D.   On: 11/03/2020 16:31   CT ABDOMEN PELVIS W CONTRAST  Result Date: 11/03/2020 CLINICAL DATA:  54 year old female with upper abdominal pain and bloating. EXAM: CT ABDOMEN AND PELVIS WITH CONTRAST TECHNIQUE: Multidetector CT imaging of the abdomen and pelvis was performed using the standard protocol following bolus administration of intravenous contrast. CONTRAST:  141m OMNIPAQUE IOHEXOL 300 MG/ML  SOLN COMPARISON:  None. FINDINGS: Lower chest: Partially visualized small right pleural effusion. There is partial right lung base atelectasis versus infiltrate. There is cardiomegaly. There is dilatation of the  right atrium with retrograde flow of contrast into the IVC in keeping with a degree of right heart dysfunction. Correlation with echocardiogram recommended. No intra-abdominal free air. Small ascites. Hepatobiliary: Slight heterogeneous enhancement of the liver. Correlation with LFTs is recommended to evaluate for possibility of hepatitis. Early cirrhosis is not excluded. There is mild periportal edema. Cholecystectomy. No retained calcified stone noted in the central CBD. Pancreas: Unremarkable. No pancreatic ductal dilatation or surrounding inflammatory changes. Spleen: Normal in size without focal abnormality. Adrenals/Urinary Tract: The adrenal glands unremarkable. The kidneys, visualized ureters, and urinary bladder appear unremarkable. Stomach/Bowel: There are scattered colonic diverticula without active inflammatory changes. There is no bowel obstruction. There is mild stranding and edema surrounding the duodenal C-loop which may be extension of inflammatory process related to underlying liver disease. Duodenitis is less likely. Clinical correlation is  recommended. The appendix is normal. Vascular/Lymphatic: The abdominal aorta and IVC appear unremarkable. No portal venous gas. There is no adenopathy. Reproductive: Hysterectomy. Other: None Musculoskeletal: Lower lumbar facet arthropathy. No acute osseous pathology. IMPRESSION: 1. Heterogeneous enhancement of the liver with findings concerning for hepatitis. Correlation with clinical exam and LFTs recommended. 2. Inflammatory changes and stranding of the fat plane surrounding the duodenal C-loop, likely extension of the inflammatory process of the liver and less likely duodenitis. No bowel obstruction. Normal appendix. 3. Colonic diverticulosis. 4. Cardiomegaly with evidence of right heart dysfunction. Correlation with echocardiogram recommended. 5. Partially visualized small right pleural effusion with partial right lung base atelectasis versus infiltrate.  Electronically Signed   By: Anner Crete M.D.   On: 11/03/2020 17:34    EKG: Atrial flutter at 56 bpm with rightwards axis and nonspecific ST-T wave changes.   Assessment/Plan  This is a 54 y.o. female with a history of afib, HTN, MS, CVA with left sided weakness now being admitted with:  #. Upper abdominal pain with concern for hepatitis/duodenitis, possible mesenteric ischemia, INR 2.2, LFTs wnl - Admit inpatient - NPO - IVFs - GI consulted by EDP for possible scope in AM - Check hepatitis panel and lactate.  - IV protonix  #. Near syncope, CT evidence of heart strain - Ttelemetry monitoring - IV fluid hydration - Check orthostatics - Check echo, carotids - Trend trops, check TSH, lipids - Cardio consult called by EDP  #. Acute kidney injury  - IV fluids and repeat BMP in AM.  - Avoid nephrotoxic medications: hold Mobic  #. History of HTN - Continue amlodipine  #. History of GERD - Continue Protonix  #. History of HLD - Continue atorvastatin  #. History of CVA - Continue aspirin  #. History of MS - Continue gabapentin  Admission status: IP IV Fluids: NS Diet/Nutrition: NPO Consults called: GI, cardio  DVT Px: SCDs and early ambulation. Code Status: Full Code  Disposition Plan: To home in 1-2 days  All the records are reviewed and case discussed with ED provider. Management plans discussed with the patient and/or family who express understanding and agree with plan of care.  Quenesha Douglass D.O. on 11/03/2020 at 9:04 PM CC: Primary care physician; Martyn Malay, DO   11/03/2020, 9:04 PM
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
## 2020-11-04 ENCOUNTER — Inpatient Hospital Stay (HOSPITAL_COMMUNITY)
Admit: 2020-11-04 | Discharge: 2020-11-04 | Disposition: A | Discharge status: Home / Self Care | Payer: Medicaid Other | Attending: Family Medicine | Admitting: Family Medicine

## 2020-11-04 ENCOUNTER — Inpatient Hospital Stay: Admit: 2020-11-04 | Payer: Medicaid Other

## 2020-11-04 DIAGNOSIS — R55 Syncope and collapse: Secondary | ICD-10-CM

## 2020-11-04 DIAGNOSIS — R42 Dizziness and giddiness: Secondary | ICD-10-CM | POA: Insufficient documentation

## 2020-11-04 DIAGNOSIS — I10 Essential (primary) hypertension: Secondary | ICD-10-CM

## 2020-11-04 DIAGNOSIS — I480 Paroxysmal atrial fibrillation: Secondary | ICD-10-CM | POA: Insufficient documentation

## 2020-11-04 LAB — HEPATITIS PANEL, ACUTE
HCV Ab: NONREACTIVE
Hep A IgM: NONREACTIVE
Hep B C IgM: NONREACTIVE
Hepatitis B Surface Ag: NONREACTIVE

## 2020-11-04 LAB — ECHOCARDIOGRAM COMPLETE
AR max vel: 2.45 cm2
AV Area VTI: 3.75 cm2
AV Area mean vel: 2.41 cm2
AV Mean grad: 2 mmHg
AV Peak grad: 3.4 mmHg
Ao pk vel: 0.93 m/s
Area-P 1/2: 4.1 cm2
Height: 60 in
S' Lateral: 2.74 cm
Weight: 2640 oz

## 2020-11-04 LAB — CBC
HCT: 33.9 % — ABNORMAL LOW (ref 36.0–46.0)
Hemoglobin: 11.2 g/dL — ABNORMAL LOW (ref 12.0–15.0)
MCH: 29.2 pg (ref 26.0–34.0)
MCHC: 33 g/dL (ref 30.0–36.0)
MCV: 88.3 fL (ref 80.0–100.0)
Platelets: 172 10*3/uL (ref 150–400)
RBC: 3.84 MIL/uL — ABNORMAL LOW (ref 3.87–5.11)
RDW: 14.8 % (ref 11.5–15.5)
WBC: 5.6 10*3/uL (ref 4.0–10.5)
nRBC: 0 % (ref 0.0–0.2)

## 2020-11-04 LAB — COMPREHENSIVE METABOLIC PANEL
ALT: 55 U/L — ABNORMAL HIGH (ref 0–44)
AST: 52 U/L — ABNORMAL HIGH (ref 15–41)
Albumin: 3.9 g/dL (ref 3.5–5.0)
Alkaline Phosphatase: 116 U/L (ref 38–126)
Anion gap: 9 (ref 5–15)
BUN: 13 mg/dL (ref 6–20)
CO2: 25 mmol/L (ref 22–32)
Calcium: 8.7 mg/dL — ABNORMAL LOW (ref 8.9–10.3)
Chloride: 109 mmol/L (ref 98–111)
Creatinine, Ser: 0.6 mg/dL (ref 0.44–1.00)
GFR, Estimated: >60 mL/min (ref >60)
Glucose, Bld: 95 mg/dL (ref 70–99)
Potassium: 3.4 mmol/L — ABNORMAL LOW (ref 3.5–5.1)
Sodium: 143 mmol/L (ref 135–145)
Total Bilirubin: 0.7 mg/dL (ref 0.3–1.2)
Total Protein: 7.4 g/dL (ref 6.5–8.1)

## 2020-11-04 LAB — LIPID PANEL
Cholesterol: 120 mg/dL (ref 0–200)
HDL: 52 mg/dL (ref >40)
LDL Cholesterol: 53 mg/dL (ref 0–99)
Total CHOL/HDL Ratio: 2.3 RATIO
Triglycerides: 73 mg/dL (ref <150)
VLDL: 15 mg/dL (ref 0–40)

## 2020-11-04 LAB — TROPONIN I (HIGH SENSITIVITY)
Troponin I (High Sensitivity): 12 ng/L (ref <18)
Troponin I (High Sensitivity): 11 ng/L (ref <18)

## 2020-11-04 LAB — HIV ANTIBODY (ROUTINE TESTING W REFLEX): HIV Screen 4th Generation wRfx: NONREACTIVE

## 2020-11-04 LAB — TSH: TSH: 9.052 u[IU]/mL — ABNORMAL HIGH (ref 0.350–4.500)

## 2020-11-04 LAB — LACTIC ACID, PLASMA: Lactic Acid, Venous: 0.8 mmol/L (ref 0.5–1.9)

## 2020-11-04 LAB — MAGNESIUM: Magnesium: 1.9 mg/dL (ref 1.7–2.4)

## 2020-11-04 LAB — PHOSPHORUS: Phosphorus: 3.2 mg/dL (ref 2.5–4.6)

## 2020-11-04 LAB — BRAIN NATRIURETIC PEPTIDE: B Natriuretic Peptide: 499.9 pg/mL — ABNORMAL HIGH (ref 0.0–100.0)

## 2020-11-04 MED ORDER — SODIUM CHLORIDE 0.9 % IV SOLN: INTRAVENOUS | Status: DC

## 2020-11-04 MED ORDER — RIVAROXABAN 20 MG PO TABS
20.0000 mg | ORAL_TABLET | Freq: Every day | ORAL | Status: DC
  Administered 2020-11-04: 18:00:00 20 mg via ORAL
  Filled 2020-11-04 (×2): qty 1

## 2020-11-04 MED ORDER — METOPROLOL TARTRATE 25 MG PO TABS
12.5000 mg | ORAL_TABLET | Freq: Two times a day (BID) | ORAL | Status: DC
  Administered 2020-11-04 – 2020-11-05 (×3): 12.5 mg via ORAL
  Filled 2020-11-04 (×3): qty 1
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
## 2020-11-04 NOTE — Progress Notes (Signed)
   11/04/20 1545  Therapy Vitals  Patient Position (if appropriate) Orthostatic Vitals  Orthostatic Lying   BP- Lying (!) 141/93  Pulse- Lying 78  Orthostatic Sitting  BP- Sitting (!) 139/105  Pulse- Sitting 80  Orthostatic Standing at 0 minutes  BP- Standing at 0 minutes (!) 149/111  Pulse- Standing at 0 minutes 83  Orthostatic Standing at 3 minutes  BP- Standing at 3 minutes 138/76 (after 2-3 minutes AMB (Still standing))  Pulse- Standing at 3 minutes 79   Orthostatic vitals taken during PT evaluation. Pt asymptomatic during assessment, does subsequently have some jello legs and SOB with sustained fast walking (~45-60 seconds), resolves with 60sec seated rest.   4:23 PM, 11/04/20 Rosamaria Lints, PT, DPT Physical Therapist - [facility]  [phone] [facility])

## 2020-11-04 NOTE — Progress Notes (Signed)
PROGRESS NOTE    [name]   LKT:[id number]  DOB: 23-Jan-1966  PCP: Tonya Barge, DO    DOA: 11/03/2020 LOS: 1   Brief Narrative   [name] is a 54 y.o. female with a known history of afib, HTN, MS, CVA with left sided weakness presents to the emergency department for evaluation of dizziness, near syncope and upper abdominal pain. .  Patient was in a usual state of health until one week ago she reports the onset of upper abdominal pain associated with dizziness and lightheadedness.  She has had these symptoms in the past and they have recurred.  .   Of note patient had a normal cardiac workup within the past month.     Patient denies fevers/chills,  chest pain, shortness of breath, N/V/C/D,  dysuria/frequency, changes in mental status.    Otherwise there has been no change in status. Patient has been taking medication as prescribed and there has been no recent change in medication or diet.  No recent antibiotics.  There has been no recent illness, hospitalizations, travel or sick contacts.     EMS/ED Course:Medical admission has been requested for further management of abdominal pain ?mesenteric ischemia, near syncope.     Assessment & Plan   Active Problems:   Near syncope   Upper abdominal pain with imaging findings of hepatitis/duodenitis There was concern for possible mesenteric ischemia, but lactate normalized. --GI consulted --Hepatitis panel negative --NPO after midnight --IV PPI --maintenance IV fluids  Near syncope - episode of dizziness/lightheadedness.  Suspect due to GI illness --Cardiology consulted --Telemetry monitoring --IV fluid hydration --Check orthostatics --Check echo, carotids --Trend trops, check TSH, lipids  Paroxysmal A-fib - ECG showed A flutter.  CHA2DS2-VASc 4. Unclear if this contributed to patient's dizziness / near syncope --Cardiology consulted Echo with preserved EF, severely dilated LA --Continue Lopressor 12.5 mg  PO BID --Resume Xarelto --Follow repeat Echo  Hypertension - controlled, chronic.  Continue amlodipine and metoprolol.  Acute kidney injury - IV fluids and repeat BMP in AM.   Avoid nephrotoxic medications: hold Mobic  History of HTN - Continue amlodipine  History of GERD - Continue Protonix  History of HLD - Continue atorvastatin  History of CVA - Continue aspirin  History of MS - Continue gabapentin  Patient BMI: Body mass index is 32.22 kg/m.   DVT prophylaxis: SCDs Start: 11/03/20 2239 rivaroxaban (XARELTO) tablet 20 mg   Diet:  Diet Orders (From admission, onward)    Start     Ordered   11/05/20 0001  Diet NPO time specified  Diet effective midnight        11/04/20 1621   11/03/20 2239  Diet Heart Room service appropriate? Yes; Fluid consistency: Thin  Diet effective now       Question Answer Comment  Room service appropriate? Yes   Fluid consistency: Thin      11/03/20 2238            Code Status: Full Code    Subjective 11/04/20    Pt seen in ED while holding for a bed.  She still has abdominal pain.  Doesn't think she's been dizzy or lightheaded today, but said can't tell, has not been up.  No n/v/d or other acute complaints.  No chest pain, SOB or palpitations.   Disposition Plan & Communication   Status is: Inpatient  Remains inpatient appropriate because:Ongoing diagnostic testing needed not appropriate for outpatient work up   Dispo: The patient is from: Home  Anticipated d/c is to: Home              Anticipated d/c date is: 1 day              Patient currently is not medically stable to d/c.   Family Communication: updated at bedside on rounds today    Consults, Procedures, Significant Events   Consultants:   Cardiology  GI  Procedures:   None  Antimicrobials:  Anti-infectives (From admission, onward)   None         Objective   Vitals:   11/04/20 0600 11/04/20 0625 11/04/20 0700 11/04/20 1700  BP:  (!) 144/126 131/87 127/79 (!) 137/103  Pulse: 100 100 92 68  Resp:  18 17 17   Temp:    98.6 F (37 C)  TempSrc:    Oral  SpO2: 96% 99% 96% 98%  Weight:      Height:       No intake or output data in the 24 hours ending 11/04/20 1747 Filed Weights   11/03/20 1122  Weight: 74.8 kg    Physical Exam:  General exam: awake, alert, no acute distress Respiratory system: CTAB, no wheezes, rales or rhonchi, normal respiratory effort. Cardiovascular system: normal S1/S2, RRR, no pedal edema.   Gastrointestinal system: soft, epigastric tenderness without rebound or guarding, ND, +bowel sounds. Central nervous system: A&O x3. no gross focal neurologic deficits, normal speech Extremities: moves all, no edema, normal tone Psychiatry: normal mood, congruent affect, judgement and insight appear normal  Labs   Data Reviewed: I have personally reviewed following labs and imaging studies  CBC: Recent Labs  Lab 11/03/20 1126 11/04/20 0333  WBC 6.9 5.6  HGB 11.7* 11.2*  HCT 35.4* 33.9*  MCV 89.4 88.3  PLT 193 172   Basic Metabolic Panel: Recent Labs  Lab 11/03/20 1126 11/03/20 2309 11/04/20 0333  NA 141  --  143  K 4.1  --  3.4*  CL 108  --  109  CO2 24  --  25  GLUCOSE 107*  --  95  BUN 27*  --  13  CREATININE 1.20*  --  0.60  CALCIUM 9.0  --  8.7*  MG  --  1.9  --   PHOS  --  3.2  --    GFR: Estimated Creatinine Clearance: 70.9 mL/min (by C-G formula based on SCr of 0.6 mg/dL). Liver Function Tests: Recent Labs  Lab 11/03/20 1126 11/04/20 0333  AST 41 52*  ALT 49* 55*  ALKPHOS 93 116  BILITOT 0.9 0.7  PROT 7.3 7.4  ALBUMIN 3.8 3.9   Recent Labs  Lab 11/03/20 1126  LIPASE 31   No results for input(s): AMMONIA in the last 168 hours. Coagulation Profile: Recent Labs  Lab 11/03/20 1126  INR 2.2*   Cardiac Enzymes: No results for input(s): CKTOTAL, CKMB, CKMBINDEX, TROPONINI in the last 168 hours. BNP (last 3 results) No results for input(s): PROBNP in the  last 8760 hours. HbA1C: No results for input(s): HGBA1C in the last 72 hours. CBG: No results for input(s): GLUCAP in the last 168 hours. Lipid Profile: Recent Labs    11/03/20 2309  CHOL 120  HDL 52  LDLCALC 53  TRIG 73  CHOLHDL 2.3   Thyroid Function Tests: Recent Labs    11/03/20 2309  TSH 9.052*   Anemia Panel: No results for input(s): VITAMINB12, FOLATE, FERRITIN, TIBC, IRON, RETICCTPCT in the last 72 hours. Sepsis Labs: Recent Labs  Lab 11/03/20 2309  11/04/20 0139  LATICACIDVEN 0.7 0.8    Recent Results (from the past 240 hour(s))  Resp Panel by RT-PCR (Flu A&B, Covid) Nasopharyngeal Swab     Status: None   Collection Time: 11/03/20  9:24 PM   Specimen: Nasopharyngeal Swab; Nasopharyngeal(NP) swabs in vial transport medium  Result Value Ref Range Status   SARS Coronavirus 2 by RT PCR NEGATIVE NEGATIVE Final    Comment: (NOTE) SARS-CoV-2 target nucleic acids are NOT DETECTED.  The SARS-CoV-2 RNA is generally detectable in upper respiratory specimens during the acute phase of infection. The lowest concentration of SARS-CoV-2 viral copies this assay can detect is 138 copies/mL. A negative result does not preclude SARS-Cov-2 infection and should not be used as the sole basis for treatment or other patient management decisions. A negative result may occur with  improper specimen collection/handling, submission of specimen other than nasopharyngeal swab, presence of viral mutation(s) within the areas targeted by this assay, and inadequate number of viral copies(<138 copies/mL). A negative result must be combined with clinical observations, patient history, and epidemiological information. The expected result is Negative.  Fact Sheet for Patients:  BloggerCourse.com  Fact Sheet for Healthcare Providers:  SeriousBroker.it  This test is no t yet approved or cleared by the [place] FDA and  has been  authorized for detection and/or diagnosis of SARS-CoV-2 by FDA under an Emergency Use Authorization (EUA). This EUA will remain  in effect (meaning this test can be used) for the duration of the COVID-19 declaration under Section 564(b)(1) of the Act, 21 U.S.C.section 360bbb-3(b)(1), unless the authorization is terminated  or revoked sooner.       Influenza A by PCR NEGATIVE NEGATIVE Final   Influenza B by PCR NEGATIVE NEGATIVE Final    Comment: (NOTE) The Xpert Xpress SARS-CoV-2/FLU/RSV plus assay is intended as an aid in the diagnosis of influenza from Nasopharyngeal swab specimens and should not be used as a sole basis for treatment. Nasal washings and aspirates are unacceptable for Xpert Xpress SARS-CoV-2/FLU/RSV testing.  Fact Sheet for Patients: BloggerCourse.com  Fact Sheet for Healthcare Providers: SeriousBroker.it  This test is not yet approved or cleared by the [place] FDA and has been authorized for detection and/or diagnosis of SARS-CoV-2 by FDA under an Emergency Use Authorization (EUA). This EUA will remain in effect (meaning this test can be used) for the duration of the COVID-19 declaration under Section 564(b)(1) of the Act, 21 U.S.C. section 360bbb-3(b)(1), unless the authorization is terminated or revoked.  Performed at [facility], [address], [place], [place] [address]       Imaging Studies   DG Chest 2 View  Result Date: 11/03/2020 CLINICAL DATA:  Dizziness EXAM: CHEST - 2 VIEW COMPARISON:  05/15/2018 FINDINGS: Small right-sided pleural effusion. No consolidation. Probable atelectasis right base. Mild cardiomegaly. No pneumothorax. IMPRESSION: Small right-sided pleural effusion with probable atelectasis at the right base. Mild cardiomegaly. Electronically Signed   By: Jasmine Pang M.D.   On: 11/03/2020 16:31   CT ABDOMEN PELVIS W CONTRAST  Result Date: 11/03/2020 CLINICAL DATA:   54 year old female with upper abdominal pain and bloating. EXAM: CT ABDOMEN AND PELVIS WITH CONTRAST TECHNIQUE: Multidetector CT imaging of the abdomen and pelvis was performed using the standard protocol following bolus administration of intravenous contrast. CONTRAST:  OMNIPAQUE IOHEXOL 300 MG/ML  SOLN COMPARISON:  None. FINDINGS: Lower chest: Partially visualized small right pleural effusion. There is partial right lung base atelectasis versus infiltrate. There is cardiomegaly. There is dilatation  of the right atrium with retrograde flow of contrast into the IVC in keeping with a degree of right heart dysfunction. Correlation with echocardiogram recommended. No intra-abdominal free air. Small ascites. Hepatobiliary: Slight heterogeneous enhancement of the liver. Correlation with LFTs is recommended to evaluate for possibility of hepatitis. Early cirrhosis is not excluded. There is mild periportal edema. Cholecystectomy. No retained calcified stone noted in the central CBD. Pancreas: Unremarkable. No pancreatic ductal dilatation or surrounding inflammatory changes. Spleen: Normal in size without focal abnormality. Adrenals/Urinary Tract: The adrenal glands unremarkable. The kidneys, visualized ureters, and urinary bladder appear unremarkable. Stomach/Bowel: There are scattered colonic diverticula without active inflammatory changes. There is no bowel obstruction. There is mild stranding and edema surrounding the duodenal C-loop which may be extension of inflammatory process related to underlying liver disease. Duodenitis is less likely. Clinical correlation is recommended. The appendix is normal. Vascular/Lymphatic: The abdominal aorta and IVC appear unremarkable. No portal venous gas. There is no adenopathy. Reproductive: Hysterectomy. Other: None Musculoskeletal: Lower lumbar facet arthropathy. No acute osseous pathology. IMPRESSION: 1. Heterogeneous enhancement of the liver with findings concerning for  hepatitis. Correlation with clinical exam and LFTs recommended. 2. Inflammatory changes and stranding of the fat plane surrounding the duodenal C-loop, likely extension of the inflammatory process of the liver and less likely duodenitis. No bowel obstruction. Normal appendix. 3. Colonic diverticulosis. 4. Cardiomegaly with evidence of right heart dysfunction. Correlation with echocardiogram recommended. 5. Partially visualized small right pleural effusion with partial right lung base atelectasis versus infiltrate. Electronically Signed   By: Elgie Collard M.D.   On: 11/03/2020 17:34   ECHOCARDIOGRAM COMPLETE  Result Date: 11/04/2020    ECHOCARDIOGRAM REPORT   Patient Name:   [name] [facility] Date of Exam: 11/04/2020 Medical Rec #:  [id number]      Height:       60.0 in Accession #:    [id number]     Weight:       165.0 lb Date of Birth:  09-29-1966      BSA:          1.720 m Patient Age:    54 years       BP:           127/79 mmHg Patient Gender: F              HR:           92 bpm. Exam Location:  [facility] Procedure: 2D Echo, Cardiac Doppler and Color Doppler Indications:     Syncope 780.2  History:         Patient has prior history of Echocardiogram examinations, most                  recent 04/14/2018. Stroke, Arrythmias:Atrial Fibrillation; Risk                  Factors:Hypertension.  Sonographer:     Cristela Blue RDCS (AE) Referring Phys:  [id number] Tonye Royalty Diagnosing Phys: Debbe Odea MD IMPRESSIONS  1. Left ventricular ejection fraction, by estimation, is 60 to 65%. The left ventricle has normal function. The left ventricle has no regional wall motion abnormalities. There is mild left ventricular hypertrophy. Left ventricular diastolic parameters are indeterminate.  2. Right ventricular systolic function is normal. The right ventricular size is normal.  3. Left atrial size was severely dilated.  4. Right atrial size was moderately dilated.  5. The mitral valve is normal in structure. Mild mitral  valve regurgitation.  6.  Tricuspid valve regurgitation is moderate.  7. The aortic valve is grossly normal. Aortic valve regurgitation is not visualized. FINDINGS  Left Ventricle: Left ventricular ejection fraction, by estimation, is 60 to 65%. The left ventricle has normal function. The left ventricle has no regional wall motion abnormalities. The left ventricular internal cavity size was normal in size. There is  mild left ventricular hypertrophy. Left ventricular diastolic parameters are indeterminate. Right Ventricle: The right ventricular size is normal. No increase in right ventricular wall thickness. Right ventricular systolic function is normal. Left Atrium: Left atrial size was severely dilated. Right Atrium: Right atrial size was moderately dilated. Pericardium: There is no evidence of pericardial effusion. Mitral Valve: The mitral valve is normal in structure. Mild mitral valve regurgitation. Tricuspid Valve: The tricuspid valve is normal in structure. Tricuspid valve regurgitation is moderate. Aortic Valve: The aortic valve is grossly normal. Aortic valve regurgitation is not visualized. Aortic valve mean gradient measures 2.0 mmHg. Aortic valve peak gradient measures 3.4 mmHg. Aortic valve area, by VTI measures 3.75 cm. Pulmonic Valve: The pulmonic valve was normal in structure. Pulmonic valve regurgitation is not visualized. Aorta: The aortic root is normal in size and structure. Venous: The inferior vena cava was not well visualized. IAS/Shunts: No atrial level shunt detected by color flow Doppler.  LEFT VENTRICLE PLAX 2D LVIDd:         4.07 cm LVIDs:         2.74 cm LV PW:         1.23 cm LV IVS:        1.48 cm LVOT diam:     2.00 cm LV SV:         49 LV SV Index:   28 LVOT Area:     3.14 cm  RIGHT VENTRICLE RV Basal diam:  3.53 cm RV S prime:     11.90 cm/s TAPSE (M-mode): 3.0 cm LEFT ATRIUM              Index       RIGHT ATRIUM           Index LA diam:        5.40 cm  3.14 cm/m  RA Area:      20.30 cm LA Vol (A2C):   96.6 ml  56.16 ml/m RA Volume:   55.90 ml  32.50 ml/m LA Vol (A4C):   100.0 ml 58.14 ml/m LA Biplane Vol: 103.0 ml 59.88 ml/m  AORTIC VALVE                   PULMONIC VALVE AV Area (Vmax):    2.45 cm    PV Vmax:        0.44 m/s AV Area (Vmean):   2.41 cm    PV Peak grad:   0.8 mmHg AV Area (VTI):     3.75 cm    RVOT Peak grad: 3 mmHg AV Vmax:           92.80 cm/s AV Vmean:          59.500 cm/s AV VTI:            0.130 m AV Peak Grad:      3.4 mmHg AV Mean Grad:      2.0 mmHg LVOT Vmax:         72.40 cm/s LVOT Vmean:        45.700 cm/s LVOT VTI:          0.155 m LVOT/AV  VTI ratio: 1.19  AORTA Ao Root diam: 3.00 cm MITRAL VALVE                TRICUSPID VALVE MV Area (PHT): 4.10 cm     TR Peak grad:   45.2 mmHg MV Decel Time: 185 msec     TR Vmax:        336.00 cm/s MV E velocity: 104.00 cm/s                             SHUNTS                             Systemic VTI:  0.16 m                             Systemic Diam: 2.00 cm Debbe Odea MD Electronically signed by Debbe Odea MD Signature Date/Time: 11/04/2020/2:21:15 PM    Final      Medications   Scheduled Meds: . amLODipine  2.5 mg Oral Daily  . atorvastatin  40 mg Oral q1800  . cholecalciferol  1,000 Units Oral Daily  . Dimethyl Fumarate  1 capsule Oral BID  . docusate sodium  100 mg Oral BID  . gabapentin  900 mg Oral TID  . metoprolol tartrate  12.5 mg Oral BID  . nicotine  7 mg Transdermal Daily  . pantoprazole (PROTONIX) IV  40 mg Intravenous Q24H  . potassium chloride  10 mEq Oral Daily  . rivaroxaban  20 mg Oral Q supper  . senna  8.6 mg Oral BID  . sodium chloride flush  3 mL Intravenous Q12H   Continuous Infusions: . sodium chloride 75 mL/hr at 11/03/20 2304       LOS: 1 day    Time spent: 30 minutes with > 50% spent at bedside and in coordination of care.    Pennie Banter, DO [facility]  11/04/2020, 5:47 PM    If 7PM-7AM, please contact night-coverage. How to  contact the [facility] Attending or Consulting provider 7A - 7P or covering provider during after hours 7P -7A, for this patient?    1. Check the care team in [facility] and look for a) attending/consulting [facility] provider listed and b) the [facility] team listed 2. Log into [email or web address] and use [place]'s universal password to access. If you do not have the password, please contact the hospital operator. 3. Locate the [facility] provider you are looking for under [facility] and page to a number that you can be directly reached. 4. If you still have difficulty reaching the provider, please page the [facility] (Director on Call) for the Hospitalists listed on amion for assistance.

## 2020-11-04 NOTE — Hospital Course (Signed)
[name] is a 54 y.o. female with a known history of afib, HTN, MS, CVA with left sided weakness presents to the emergency department for evaluation of dizziness, near syncope and upper abdominal pain. .  Patient was in a usual state of health until one week ago she reports the onset of upper abdominal pain associated with dizziness and lightheadedness.  She has had these symptoms in the past and they have recurred.  .   Of note patient had a normal cardiac workup within the past month.     Patient denies fevers/chills,  chest pain, shortness of breath, N/V/C/D,  dysuria/frequency, changes in mental status.    Otherwise there has been no change in status. Patient has been taking medication as prescribed and there has been no recent change in medication or diet.  No recent antibiotics.  There has been no recent illness, hospitalizations, travel or sick contacts.     EMS/ED Course:Medical admission has been requested for further management of abdominal pain ?mesenteric ischemia, near syncope.

## 2020-11-04 NOTE — Progress Notes (Signed)
*  PRELIMINARY RESULTS* Echocardiogram 2D Echocardiogram has been performed.  Cristela Blue 11/04/2020, 1:21 PM

## 2020-11-04 NOTE — Consult Note (Signed)
Cardiology Consultation:   Patient ID: [name] MRN: [id number]; DOB: 22-Sep-1966  Admit date: 11/03/2020 Date of Consult: 11/04/2020  Primary Care Provider: Martyn Malay, [place] Cardiologist: none- follows with [facility] cardiology [place] Electrophysiologist:  None    Patient Profile:   [name] is a 54 y.o. female with a hx of paroxysmal A. fib, hypertension, CVA, visual impairment who is being seen today for the evaluation of cardiomegaly at the request of Dr. Ara Kussmaul.  History of Present Illness:   [name] is a 54 year old lady with history of paroxysmal A. fib, hypertension, CVA with left-sided weakness, visual impairment, MS who presents due to 2-day history of worsening dizziness.  Patient was at church when symptoms began.  She was walking to the bathroom when she became dizzy, felt like she was going to pass out and sat down.  Symptoms persisted typically when standing over the next 2 days, prompting her to present to the hospital for evaluation.  She also endorses nausea but denies vomiting.  Denies chest pain, palpitations, shortness of breath.  Recently started on Xarelto and beta-blocker for A. fib.  Outpatient work-up with echo 07/2020 EF normal,  Severely dilated LA.  [place] outpatient 09/2020 with no evidence for ischemia.  Outpatient cardioversion was recently planned but upon presentation, she was noted to be in sinus rhythm.  DC cardioversion was canceled.  Takes Xarelto as prescribed but missed dose yesterday.  Due to concerns for nausea, upper abdominal pain, CT abdomen was ordered which revealed cardiomegaly and possible right heart dysfunction.  Echocardiogram was ordered.  She has a history of visual impairment, uses a cane to help with mobility.   Past Medical History:  Diagnosis Date  . A-fib ([place])   . Hypertension   . Legally blind   . Multiple sclerosis ([place]) 2001  . Multiple sclerosis ([place])   . Retinitis  pigmentosa 2007  . Stroke [facility])     Past Surgical History:  Procedure Laterality Date  . ABDOMINAL HYSTERECTOMY    . COLONOSCOPY WITH PROPOFOL N/A 07/03/2020   Procedure: COLONOSCOPY WITH PROPOFOL;  Surgeon: Lesly Rubenstein, MD;  [facility];  Service: Endoscopy;  Laterality: N/A;  . CYST REMOVAL NECK     spine     Home Medications:  Prior to Admission medications   Medication Sig Start Date End Date Taking? Authorizing Provider  amLODipine (NORVASC) 2.5 MG tablet Take 1 tablet (2.5 mg total) by mouth daily. 05/18/18   Loletha Grayer, MD  aspirin 325 MG tablet Take 1 tablet (325 mg total) by mouth daily. 05/18/18   Loletha Grayer, MD  atorvastatin (LIPITOR) 40 MG tablet Take 1 tablet (40 mg total) by mouth daily at 6 PM. 05/17/18   Loletha Grayer, MD  B Complex Vitamins (B-COMPLEX/B-12 PO) Take by mouth.    [provider]  cholecalciferol (VITAMIN D3) 25 MCG (1000 UNIT) tablet Take 1,000 Units by mouth daily.    [provider]  Dimethyl Fumarate (TECFIDERA) 240 MG CPDR Take 1 capsule by mouth 2 (two) times daily.    [provider]  docusate sodium (COLACE) 100 MG capsule Take 100 mg by mouth 2 (two) times daily.    [provider]  gabapentin (NEURONTIN) 300 MG capsule Take 900 mg by mouth 3 (three) times daily.     [provider]  Interferon Beta-1b (BETASERON) 0.3 MG KIT injection Inject 0.3 mg into the skin every other day. Patient not taking: Reported on 07/03/2020 11/25/15  [provider]  meloxicam (MOBIC) 15 MG tablet Take 1 tablet (15 mg total) by mouth at bedtime as needed for pain. Patient not taking: Reported on 07/03/2020 05/17/18   Loletha Grayer, MD  methylphenidate (RITALIN) 10 MG tablet Take 10 mg by mouth daily. Patient not taking: Reported on 07/03/2020 05/02/18   [provider]  nicotine (NICODERM CQ - DOSED IN MG/24 HR) 7 mg/24hr patch Place 1 patch (7 mg total) onto the skin daily.  Okay to substitute generic patch 05/17/18   Loletha Grayer, MD  pantoprazole (PROTONIX) 40 MG tablet Take 40 mg by mouth daily.    [provider]  potassium chloride (K-DUR,KLOR-CON) 10 MEQ tablet Take 1 tablet by mouth daily. 04/12/18   [provider]  senna (SENOKOT) 8.6 MG TABS tablet Take 8.6 mg by mouth 2 (two) times daily. 05/14/18   [provider]  traZODone (DESYREL) 150 MG tablet Take 1 tablet (150 mg total) by mouth at bedtime. Patient not taking: Reported on 07/03/2020 05/17/18   Loletha Grayer, MD  vitamin E (VITAMIN E) 1000 UNIT capsule Take 1,000 Units by mouth daily. Patient not taking: Reported on 07/03/2020    [provider]    Inpatient Medications: Scheduled Meds: . amLODipine  2.5 mg Oral Daily  . atorvastatin  40 mg Oral q1800  . cholecalciferol  1,000 Units Oral Daily  . Dimethyl Fumarate  1 capsule Oral BID  . docusate sodium  100 mg Oral BID  . gabapentin  900 mg Oral TID  . nicotine  7 mg Transdermal Daily  . pantoprazole (PROTONIX) IV  40 mg Intravenous Q24H  . potassium chloride  10 mEq Oral Daily  . rivaroxaban  20 mg Oral Q supper  . senna  8.6 mg Oral BID  . sodium chloride flush  3 mL Intravenous Q12H   Continuous Infusions: . sodium chloride 75 mL/hr at 11/03/20 2304   PRN Meds: acetaminophen **OR** acetaminophen, alum & mag hydroxide-simeth, bisacodyl, HYDROcodone-acetaminophen, iohexol, magnesium citrate, ondansetron **OR** ondansetron (ZOFRAN) IV, senna-docusate, zolpidem  Allergies:    Allergies  Allergen Reactions  . Strawberry (Diagnostic) Swelling  . Carbamazepine Rash  . Carbamazepine Itching and Rash    Social History:   Social History   Socioeconomic History  . Marital status: Married    Spouse name: Not on file  . Number of children: Not on file  . Years of education: Not on file  . Highest education level: Not on file  Occupational History  . Not on file  Tobacco Use  . Smoking status:  Former Smoker    Packs/day: 0.33    Years: 30.00    Pack years: 9.90    Types: Cigarettes    Quit date: 06/21/2018    Years since quitting: 2.3  . Smokeless tobacco: Never Used  . Tobacco comment: Pt reports that she wants to quit, but that she is not actively  quitting  Substance and Sexual Activity  . Alcohol use: No  . Drug use: Never  . Sexual activity: Not Currently  Other Topics Concern  . Not on file  Social History Narrative   ** Merged History Encounter **       Social Determinants of Health   Financial Resource Strain: Not on file  Food Insecurity: Not on file  Transportation Needs: Not on file  Physical Activity: Not on file  Stress: Not on file  Social Connections: Not on file  Intimate Partner Violence: Not on file  Family History:   No family history on file.   ROS:  Please see the history of present illness.   All other ROS reviewed and negative.     Physical Exam/Data:   Vitals:   11/04/20 0410 11/04/20 0600 11/04/20 0625 11/04/20 0700  BP: 114/69 (!) 144/126 131/87 127/79  Pulse: 69 100 100 92  Resp: '18  18 17  ' Temp:      TempSrc:      SpO2: 94% 96% 99% 96%  Weight:      Height:       No intake or output data in the 24 hours ending 11/04/20 1319 Last 3 Weights 11/03/2020 07/03/2020 05/15/2018  Weight (lbs) 165 lb 156 lb 122 lb 14.4 oz  Weight (kg) 74.844 kg 70.761 kg 55.747 kg     Body mass index is 32.22 kg/m.  General:  Well nourished, well developed, in no acute distress HEENT: Visually impaired Lymph: no adenopathy Neck: no JVD Endocrine:  No thryomegaly Vascular: No carotid bruits; FA pulses 2+ bilaterally without bruits  Cardiac: Irregular irregular, no murmurs Lungs:  clear to auscultation bilaterally, no wheezing, rhonchi or rales  Abd: soft, nontender, no hepatomegaly  Ext: no edema Musculoskeletal:  No deformities, BUE and BLE strength normal and equal Skin: warm and dry  Neuro: Visual impairment, weakness noted Psych:   Normal affect   EKG:  The EKG was personally reviewed and demonstrates: Atrial flutter heart rate 56 Telemetry:  Telemetry was personally reviewed and demonstrates: Currently off telemetry  Relevant CV Studies: Outside echocardiogram 07/2020  -EF greater than 55%, severely dilated LA, normal RV size and function  Lexiscan Myoview 09/2020, no evidence for ischemia, EF 60%   Laboratory Data:  High Sensitivity Troponin:   Recent Labs  Lab 11/03/20 1126 11/03/20 2309  TROPONINIHS 11 12     Chemistry Recent Labs  Lab 11/03/20 1126 11/04/20 0333  NA 141 143  K 4.1 3.4*  CL 108 109  CO2 24 25  GLUCOSE 107* 95  BUN 27* 13  CREATININE 1.20* 0.60  CALCIUM 9.0 8.7*  GFRNONAA 53* >60  ANIONGAP 9 9    Recent Labs  Lab 11/03/20 1126 11/04/20 0333  PROT 7.3 7.4  ALBUMIN 3.8 3.9  AST 41 52*  ALT 49* 55*  ALKPHOS 93 116  BILITOT 0.9 0.7   Hematology Recent Labs  Lab 11/03/20 1126 11/04/20 0333  WBC 6.9 5.6  RBC 3.96 3.84*  HGB 11.7* 11.2*  HCT 35.4* 33.9*  MCV 89.4 88.3  MCH 29.5 29.2  MCHC 33.1 33.0  RDW 14.9 14.8  PLT 193 172   BNP Recent Labs  Lab 11/03/20 2350  BNP 499.9*    DDimer No results for input(s): DDIMER in the last 168 hours.   Radiology/Studies:  DG Chest 2 View  Result Date: 11/03/2020 CLINICAL DATA:  Dizziness EXAM: CHEST - 2 VIEW COMPARISON:  05/15/2018 FINDINGS: Small right-sided pleural effusion. No consolidation. Probable atelectasis right base. Mild cardiomegaly. No pneumothorax. IMPRESSION: Small right-sided pleural effusion with probable atelectasis at the right base. Mild cardiomegaly. Electronically Signed   By: Donavan Foil M.D.   On: 11/03/2020 16:31   CT ABDOMEN PELVIS W CONTRAST  Result Date: 11/03/2020 CLINICAL DATA:  54 year old female with upper abdominal pain and bloating. EXAM: CT ABDOMEN AND PELVIS WITH CONTRAST TECHNIQUE: Multidetector CT imaging of the abdomen and pelvis was performed using the standard protocol  following bolus administration of intravenous contrast. CONTRAST:  147m OMNIPAQUE IOHEXOL 300 MG/ML  SOLN  COMPARISON:  None. FINDINGS: Lower chest: Partially visualized small right pleural effusion. There is partial right lung base atelectasis versus infiltrate. There is cardiomegaly. There is dilatation of the right atrium with retrograde flow of contrast into the IVC in keeping with a degree of right heart dysfunction. Correlation with echocardiogram recommended. No intra-abdominal free air. Small ascites. Hepatobiliary: Slight heterogeneous enhancement of the liver. Correlation with LFTs is recommended to evaluate for possibility of hepatitis. Early cirrhosis is not excluded. There is mild periportal edema. Cholecystectomy. No retained calcified stone noted in the central CBD. Pancreas: Unremarkable. No pancreatic ductal dilatation or surrounding inflammatory changes. Spleen: Normal in size without focal abnormality. Adrenals/Urinary Tract: The adrenal glands unremarkable. The kidneys, visualized ureters, and urinary bladder appear unremarkable. Stomach/Bowel: There are scattered colonic diverticula without active inflammatory changes. There is no bowel obstruction. There is mild stranding and edema surrounding the duodenal C-loop which may be extension of inflammatory process related to underlying liver disease. Duodenitis is less likely. Clinical correlation is recommended. The appendix is normal. Vascular/Lymphatic: The abdominal aorta and IVC appear unremarkable. No portal venous gas. There is no adenopathy. Reproductive: Hysterectomy. Other: None Musculoskeletal: Lower lumbar facet arthropathy. No acute osseous pathology. IMPRESSION: 1. Heterogeneous enhancement of the liver with findings concerning for hepatitis. Correlation with clinical exam and LFTs recommended. 2. Inflammatory changes and stranding of the fat plane surrounding the duodenal C-loop, likely extension of the inflammatory process of the  liver and less likely duodenitis. No bowel obstruction. Normal appendix. 3. Colonic diverticulosis. 4. Cardiomegaly with evidence of right heart dysfunction. Correlation with echocardiogram recommended. 5. Partially visualized small right pleural effusion with partial right lung base atelectasis versus infiltrate. Electronically Signed   By: Anner Crete M.D.   On: 11/03/2020 17:34     Assessment and Plan:   1.  Paroxysmal atrial fibrillation -EKG showed atrial flutter -CHA2DS2-VASc 4 -Echo with preserved ejection fraction, severely dilated LA.  Very low chance to maintain sinus rhythm due to severely dilated LA. -Lopressor 12.5 mg twice daily.  Restart Xarelto 20 mg daily. -Unsure if A. fib/flutter is contributing to patient's dizziness. -Ablation being considered outpatient by primary cardiologist.  2.  RV dilation on CT -Echocardiogram was ordered by admitting physician -Will review repeat echocardiogram after it is performed -I do not anticipate any additional cardiac work-up  3.  Hypertension -BP controlled.  Amlodipine, Lopressor    Signed, Kate Sable, MD  11/04/2020 1:19 PM

## 2020-11-04 NOTE — Evaluation (Signed)
Physical Therapy Evaluation Patient Details Name: [name] MRN: [id number] DOB: 11-06-1966 Today's Date: 11/04/2020   History of Present Illness  [name] is a 54yoF who [facility] on 12/14 after 2 days of worsening dizziness. Pt felt presyncopal while walking to BR at church. PMH: PAF, HTN, CVA c Lt hemiplegia, visual impairment, MS. Pt noted to have upper ABD pain, GI consult pending. Pt reports soem recent medication changes for her AF, a 'blood thinner', 'somthing to slow my heart down,' and 'something else.  Clinical Impression  Pt admitted with above diagnosis. Pt currently with functional limitations due to the deficits listed below (see "PT Problem List"). Upon entry, pt in bed, awake and agreeable to participate. The pt is alert, pleasant, interactive, and able to provide info regarding prior level of function, both in tolerance and independence. Patient's performance this date reveals decreased ability, independence, and tolerance in performing all basic mobility required for performance of activities of daily living. Pt has negative orthostatic BP, diminished HR response with postural changes, SOB with sustained AMB, and 1 instance of sudden onset global weakness warranting a sit break. PT is normostatic during and after activity. Pt requires additional DME, close physical assistance, and cues for safe participate in mobility. Pt will benefit from skilled PT intervention to increase independence and safety with basic mobility in preparation for discharge to the venue listed below.       Follow Up Recommendations No PT follow up (HHPT not available for [place] Medicaid)    Equipment Recommendations  None recommended by PT    Recommendations for Other Services       Precautions / Restrictions Precautions Precautions: Fall Restrictions Weight Bearing Restrictions: No      Mobility  Bed Mobility Overal bed mobility: Independent                   Transfers Overall transfer level: Independent                  Ambulation/Gait Ambulation/Gait assistance: Supervision;Min guard Gait Distance (Feet): 500 Feet Assistive device: None Gait Pattern/deviations: WFL(Within Functional Limits) Gait velocity: 1.27m/s Gait velocity interpretation: >2.62 ft/sec, indicative of community ambulatory General Gait Details: Author provides verbal navigation due to blindness. Pt has Jello Legs after first AMB attempt, asks to sit, then is able to walk 3 more times for 100-256ft with breaks for BP assessment. No further symptoms, no further orthostatis.  Stairs            Wheelchair Mobility    Modified Rankin (Stroke Patients Only)       Balance Overall balance assessment: Independent;No apparent balance deficits (not formally assessed)                                           Pertinent Vitals/Pain Pain Assessment: No/denies pain    Home Living Family/patient expects to be discharged to:: Private residence Living Arrangements: Alone Available Help at Discharge: Personal care attendant;Friend(s) (aide in home M-F 2.5 hrs) Type of Home: Apartment Home Access: Stairs to enter Entrance Stairs-Rails: Right Entrance Stairs-Number of Steps: 17 Home Layout: One level Home Equipment: Other (comment) (cane for visually impaired)      Prior Function Level of Independence: Independent with assistive device(s);Needs assistance   Gait / Transfers Assistance Needed: AMB with cane for visually impaired, runs on treadmill for fitness, aide helps with  pciking out closes, meals, groceries.  ADL's / Homemaking Assistance Needed: independent  Comments: Just finished OPPT for a shoulder issue; regular fitness workouts at [employer], weights and running.     Hand Dominance   Dominant Hand: Right    Extremity/Trunk Assessment   Upper Extremity Assessment Upper Extremity Assessment: Overall WFL for tasks  assessed    Lower Extremity Assessment Lower Extremity Assessment: Overall WFL for tasks assessed       Communication   Communication: No difficulties  Cognition Arousal/Alertness: Awake/alert Behavior During Therapy: WFL for tasks assessed/performed Overall Cognitive Status: Within Functional Limits for tasks assessed                                        General Comments      Exercises     Assessment/Plan    PT Assessment Patient needs continued PT services  PT Problem List Decreased activity tolerance;Decreased mobility;Cardiopulmonary status limiting activity       PT Treatment Interventions Balance training;Gait training;Functional mobility training;Therapeutic activities;Therapeutic exercise;Patient/family education    PT Goals (Current goals can be found in the Care Plan section)  Acute Rehab PT Goals Patient Stated Goal: regain baseline actiivty tolerance PT Goal Formulation: With patient Time For Goal Achievement: 11/18/20 Potential to Achieve Goals: Good    Frequency Min 2X/week   Barriers to discharge Inaccessible home environment has 17 stairs to enter apartment    Co-evaluation               AM-PAC PT "6 Clicks" Mobility  Outcome Measure Help needed turning from your back to your side while in a flat bed without using bedrails?: None Help needed moving from lying on your back to sitting on the side of a flat bed without using bedrails?: None Help needed moving to and from a bed to a chair (including a wheelchair)?: None Help needed standing up from a chair using your arms (e.g., wheelchair or bedside chair)?: None Help needed to walk in hospital room?: A Little Help needed climbing 3-5 steps with a railing? : A Little 6 Click Score: 22    End of Session Equipment Utilized During Treatment: Gait belt Activity Tolerance: Patient tolerated treatment well;No increased pain;Treatment limited secondary to medical complications  (Comment) (some jello legs early in session) Patient left: in bed;with family/visitor present;with call bell/phone within reach Nurse Communication: Mobility status PT Visit Diagnosis: Unsteadiness on feet (R26.81);Difficulty in walking, not elsewhere classified (R26.2);Other abnormalities of gait and mobility (R26.89)    Time: 0518-3358 PT Time Calculation (min) (ACUTE ONLY): 32 min   Charges:   PT Evaluation $PT Eval Moderate Complexity: 1 Mod PT Treatments $Therapeutic Exercise: 8-22 mins        4:21 PM, 11/04/20 Rosamaria Lints, PT, DPT Physical Therapist - [facility]  [phone] (ASCOM)    Malcom Selmer C 11/04/2020, 4:18 PM

## 2020-11-04 NOTE — Progress Notes (Signed)
Patient is refusing bed alarm. RN asked pt to walk in room to demonstrate steady gait. Pt is steady. Provided info on safety while in room and getting oob. Patient verbalized understanding and will call if assistance is needed.

## 2020-11-05 DIAGNOSIS — I4819 Other persistent atrial fibrillation: Secondary | ICD-10-CM

## 2020-11-05 LAB — BASIC METABOLIC PANEL
Anion gap: 10 (ref 5–15)
BUN: 8 mg/dL (ref 6–20)
CO2: 28 mmol/L (ref 22–32)
Calcium: 9.3 mg/dL (ref 8.9–10.3)
Chloride: 102 mmol/L (ref 98–111)
Creatinine, Ser: 0.59 mg/dL (ref 0.44–1.00)
GFR, Estimated: >60 mL/min (ref >60)
Glucose, Bld: 120 mg/dL — ABNORMAL HIGH (ref 70–99)
Potassium: 3.6 mmol/L (ref 3.5–5.1)
Sodium: 140 mmol/L (ref 135–145)

## 2020-11-05 LAB — GLUCOSE, CAPILLARY: Glucose-Capillary: 86 mg/dL (ref 70–99)

## 2020-11-05 SURGERY — EGD (ESOPHAGOGASTRODUODENOSCOPY)
Anesthesia: General

## 2020-11-05 MED ORDER — PANTOPRAZOLE SODIUM 40 MG PO TBEC: 40.0000 mg | DELAYED_RELEASE_TABLET | Freq: Every day | ORAL | 1 refills | Status: DC

## 2020-11-05 MED ORDER — METOPROLOL TARTRATE 25 MG PO TABS: 25.0000 mg | ORAL_TABLET | Freq: Two times a day (BID) | ORAL | 1 refills | Status: DC

## 2020-11-05 MED ORDER — AMLODIPINE BESYLATE 2.5 MG PO TABS: 2.5000 mg | ORAL_TABLET | Freq: Every day | ORAL | 0 refills | Status: DC

## 2020-11-05 MED ORDER — NICOTINE 7 MG/24HR TD PT24: 7.0000 mg | MEDICATED_PATCH | Freq: Every day | TRANSDERMAL | 0 refills | Status: DC

## 2020-11-05 MED ORDER — METOPROLOL TARTRATE 25 MG PO TABS: 25.0000 mg | ORAL_TABLET | Freq: Two times a day (BID) | ORAL | Status: DC

## 2020-11-05 MED ORDER — METOPROLOL TARTRATE 25 MG PO TABS: 12.5000 mg | ORAL_TABLET | Freq: Two times a day (BID) | ORAL | 1 refills | Status: DC

## 2020-11-05 NOTE — Plan of Care (Signed)

## 2020-11-05 NOTE — Progress Notes (Signed)
Progress Note  Patient Name: [name] Date of Encounter: 11/05/2020  Primary Cardiologist: [facility]  Subjective   No chest pain, dyspnea, or palpitations.  Dizziness improved.  She does note some intermittent tachypalpitations with positional changes.  High-sensitivity troponin negative.  Echo this admission showed a preserved LV SF with normal-sized RV and a severely dilated left atrium with mild MR and moderate TR.  Inpatient Medications    Scheduled Meds: . amLODipine  2.5 mg Oral Daily  . atorvastatin  40 mg Oral q1800  . cholecalciferol  1,000 Units Oral Daily  . Dimethyl Fumarate  1 capsule Oral BID  . docusate sodium  100 mg Oral BID  . gabapentin  900 mg Oral TID  . metoprolol tartrate  12.5 mg Oral BID  . nicotine  7 mg Transdermal Daily  . pantoprazole (PROTONIX) IV  40 mg Intravenous Q24H  . potassium chloride  10 mEq Oral Daily  . rivaroxaban  20 mg Oral Q supper  . senna  8.6 mg Oral BID  . sodium chloride flush  3 mL Intravenous Q12H   Continuous Infusions:  PRN Meds: acetaminophen **OR** acetaminophen, alum & mag hydroxide-simeth, bisacodyl, HYDROcodone-acetaminophen, iohexol, magnesium citrate, ondansetron **OR** ondansetron (ZOFRAN) IV, senna-docusate, zolpidem   Vital Signs    Vitals:   11/05/20 0450 11/05/20 0500 11/05/20 0800 11/05/20 1202  BP: 118/75  (!) 145/93 125/90  Pulse: 61  81 74  Resp: 18  18 16   Temp: 98.6 F (37 C)   98.7 F (37.1 C)  TempSrc: Oral   Oral  SpO2: 95%  97% 99%  Weight:  76.8 kg    Height:        Intake/Output Summary (Last 24 hours) at 11/05/2020 1439 Last data filed at 11/05/2020 1412 Gross per 24 hour  Intake 363 ml  Output --  Net 363 ml   Filed Weights   11/03/20 1122 11/05/20 0500  Weight: 74.8 kg 76.8 kg    Telemetry    A. fib/flutter, 70s bpm - Personally Reviewed  ECG    No new tracings - Personally Reviewed  Physical Exam   GEN: No acute distress.   Neck: No JVD. Cardiac: IRIR, II/VI  systolic murmur, no rubs, or gallops.  Respiratory: Clear to auscultation bilaterally.  GI: Soft, nontender, non-distended.   MS: No edema; No deformity. Neuro:  Alert and oriented x 3; Nonfocal.  Psych: Normal affect.  Labs    Chemistry Recent Labs  Lab 11/03/20 1126 11/04/20 0333 11/05/20 0847  NA 141 143 140  K 4.1 3.4* 3.6  CL 108 109 102  CO2 24 25 28   GLUCOSE 107* 95 120*  BUN 27* 13 8  CREATININE 1.20* 0.60 0.59  CALCIUM 9.0 8.7* 9.3  PROT 7.3 7.4  --   ALBUMIN 3.8 3.9  --   AST 41 52*  --   ALT 49* 55*  --   ALKPHOS 93 116  --   BILITOT 0.9 0.7  --   GFRNONAA 53* >60 >60  ANIONGAP 9 9 10      Hematology Recent Labs  Lab 11/03/20 1126 11/04/20 0333  WBC 6.9 5.6  RBC 3.96 3.84*  HGB 11.7* 11.2*  HCT 35.4* 33.9*  MCV 89.4 88.3  MCH 29.5 29.2  MCHC 33.1 33.0  RDW 14.9 14.8  PLT 193 172    Cardiac EnzymesNo results for input(s): TROPONINI in the last 168 hours. No results for input(s): TROPIPOC in the last 168 hours.   BNP  Recent Labs  Lab 11/03/20 2350  BNP 499.9*     DDimer No results for input(s): DDIMER in the last 168 hours.   Radiology    DG Chest 2 View  Result Date: 11/03/2020 IMPRESSION: Small right-sided pleural effusion with probable atelectasis at the right base. Mild cardiomegaly. Electronically Signed   By: Jasmine Pang M.D.   On: 11/03/2020 16:31   CT ABDOMEN PELVIS W CONTRAST  Result Date: 11/03/2020 IMPRESSION: 1. Heterogeneous enhancement of the liver with findings concerning for hepatitis. Correlation with clinical exam and LFTs recommended. 2. Inflammatory changes and stranding of the fat plane surrounding the duodenal C-loop, likely extension of the inflammatory process of the liver and less likely duodenitis. No bowel obstruction. Normal appendix. 3. Colonic diverticulosis. 4. Cardiomegaly with evidence of right heart dysfunction. Correlation with echocardiogram recommended. 5. Partially visualized small right pleural  effusion with partial right lung base atelectasis versus infiltrate. Electronically Signed   By: Elgie Collard M.D.   On: 11/03/2020 17:34   Cardiac Studies   2D echo 11/05/2020: 1. Left ventricular ejection fraction, by estimation, is 60 to 65%. The  left ventricle has normal function. The left ventricle has no regional  wall motion abnormalities. There is mild left ventricular hypertrophy.  Left ventricular diastolic parameters  are indeterminate.  2. Right ventricular systolic function is normal. The right ventricular  size is normal.  3. Left atrial size was severely dilated.  4. Right atrial size was moderately dilated.  5. The mitral valve is normal in structure. Mild mitral valve  regurgitation.  6. Tricuspid valve regurgitation is moderate.  7. The aortic valve is grossly normal. Aortic valve regurgitation is not  visualized.  __________  Nuclear stress test 09/2020 [facility]): - Normal myocardial perfusion study  - No evidence of any significant ischemia or scar  - Left ventricular systolic function is normal. Post stress the ejection  fraction is > 60%.  - No significant coronary calcifications were noted on the attenuation CT  __________  Zio patch 08/2020 [facility]): Analysis time: 1 day, 14 hours   Atrial Flutter occurred continuously (100% burden), ranging from 65-195 bpm (avg of 111 bpm).   Isolated VEs were rare (<1.0%), VE Couplets were rare (<1.0%), and no VE Triplets were present. Ventricular Bigeminy was present.   There were no patient triggered events or diary entries.  Please see attached document for details of the patient triggered events and diary entries.  __________  2D echo 07/2020 [facility] 1. The left ventricle is normal in size with mildly increased wall  thickness.  2. The left ventricular systolic function is normal,LVEF is visually  estimated at > 55%.  3. The mitral valve leaflets are mildly thickened with normal leaflet  mobility.   4. There is mild mitral valve regurgitation.  5. The left atrium is severely dilated in size.  6. The right ventricle is normal in size, with normal systolic function.  __________  2D echo 04/2018: - Left ventricle: Wall thickness was increased in a pattern of mild  LVH. Systolic function was normal. The estimated ejection  fraction was in the range of 60% to 65%. Doppler parameters are  consistent with abnormal left ventricular relaxation (grade 1  diastolic dysfunction).  - Left atrium: The atrium was mildly dilated.  Patient Profile     54 y.o. female with history of persistent A. fib/flutter, CVA with residual left-sided weakness, legally blind, MS, subarachnoid hemorrhage, HTN, obesity, and depression who we are seeing for evaluation of  presyncope.  Assessment & Plan    1.  Presyncope: -Suspected to be in the setting of GI illness, though cannot exclude possible symptomatic A. fib/flutter based on her symptoms -She does note episodes of tachypalpitations with positional changes though when working with PT this admission heart rate and BP remained well controlled -Orthostatics negative -Troponin negative x3 with recent nuclear stress test at [facility] 2 weeks ago showing no evidence of ischemia or scar with no significant coronary artery calcifications noted on CT attenuated imaging -No evidence of ACS -No plans for inpatient ischemic evaluation -We will hold amlodipine with titration of Lopressor as outlined below for added ventricular rate control -Recommend she follow-up with her primary cardiology group for further discussion of rhythm control strategies as it was noted that they had recommended she start flecainide though it does not appear this has occurred yet versus consideration of ablation  2.  Persistent A. fib/flutter: -Ventricular rates remain well controlled though she does report tachypalpitations with positional changes at times -Stop amlodipine to allow  for added BP room before further heart rate control with titration of Lopressor to 25 mg twice daily -Given her CHA2DS2-VASc of 4 recommend she continue Xarelto 20 mg daily -Estimated Creatinine Clearance: 71.9 mL/min (by C-G formula based on SCr of 0.59 mg/dL). -Possibly contributing to her symptoms of dizziness/presyncope -Defer role of flecainide to her primary cardiology group given concern of possible nonadherence with Xarelto and with documented severely dilated left atrium on echo measuring 54 mm  3.  History of CVA: -She remains on Xarelto in the setting of persistent A. Fib/flutter -PTA statin  4.  HTN: -Blood pressure well controlled -Continue medical therapy as outlined above  For questions or updates, please contact [facility] Please consult [email or web address] for contact info under Cardiology/STEMI.    Signed, Eula Listen, PA-C [facility] Pager: [phone] 11/05/2020, 2:39 PM

## 2020-11-05 NOTE — Discharge Summary (Signed)
Physician Discharge Summary  [name] RSW:[id number] DOB: 01-18-1966 DOA: 11/03/2020  PCP: Martyn Malay, DO  Admit date: 11/03/2020 Discharge date: 11/05/2020  Admitted From: home Disposition:  home  Recommendations for Outpatient Follow-up:  1. Follow up with PCP in 1-2 weeks 2. Please obtain BMP/CBC in one week 3. Please follow up with GI in 3-4 weeks  Home Health: No  Equipment/Devices: None   Discharge Condition: Stable  CODE STATUS: Full  Diet recommendation: Heart Healthy / Carb Modified    Discharge Diagnoses: Active Problems:   Near syncope    Summary of HPI and Hospital Course:  [name] is a 54 y.o. female with a known history of afib, HTN, MS, CVA with left sided weakness presents to the emergency department for evaluation of dizziness, near syncope and upper abdominal pain. .  Patient was in a usual state of health until one week ago she reports the onset of upper abdominal pain associated with dizziness and lightheadedness.  She has had these symptoms in the past and they have recurred.  .   Of note patient had a normal cardiac workup within the past month.     Patient denies fevers/chills,  chest pain, shortness of breath, N/V/C/D,  dysuria/frequency, changes in mental status.    Otherwise there has been no change in status. Patient has been taking medication as prescribed and there has been no recent change in medication or diet.  No recent antibiotics.  There has been no recent illness, hospitalizations, travel or sick contacts.     EMS/ED Course:Medical admission has been requested for further management of abdominal pain ?mesenteric ischemia, near syncope.      Upper abdominal pain with imaging findings of hepatitis/duodenitis There was concern for possible mesenteric ischemia, but lactate normalized, abdominal pain resolved. GI was consulted. Hepatitis panel negative. Treated with IV PPI and IV fluids. Follow up H. Pylori stool  antigen and pending autoimmune hepatitis labs Discharge on twice daily oral PPI.   Near syncope - episode of dizziness/lightheadedness.  Suspect due to GI illness. Echo normal EF but showed dilated atria and mod TR. Cardiology was consulted.   Telemetry monitoring without notable events. Treated with IV fluid hydration Orthostatics were negative.   Paroxysmal A-fib - ECG showed A flutter.  CHA2DS2-VASc 4. Unclear if this contributed to patient's dizziness / near syncope Cardiology was consulted Echo with preserved EF, severely dilated LA Continue Lopressor 12.5 mg PO BID Resume Xarelto    Discharge Instructions   Discharge Instructions    Call MD for:  extreme fatigue   Complete by: As directed    Call MD for:  persistant dizziness or light-headedness   Complete by: As directed    Call MD for:  persistant nausea and vomiting   Complete by: As directed    Call MD for:  severe uncontrolled pain   Complete by: As directed    Call MD for:  temperature >100.4   Complete by: As directed    Diet - low sodium heart healthy   Complete by: As directed    Discharge instructions   Complete by: As directed    Please follow up with your primary care doctor in 1-2 weeks.  There are some labs that are pending, results will take at least a few days to come back.  If anything is abnormal, I will call to let you know, and your primary care doctor will be able to see those results also.   Increase activity slowly  Complete by: As directed      Allergies as of 11/05/2020      Reactions   Strawberry (diagnostic) Swelling   Carbamazepine Rash   Carbamazepine Itching, Rash      Medication List    STOP taking these medications   amLODipine 10 MG tablet Commonly known as: NORVASC   amLODipine 2.5 MG tablet Commonly known as: Norvasc   aspirin EC 325 MG tablet   Betaseron 0.3 MG Kit injection Generic drug: Interferon Beta-1b   meloxicam 15 MG tablet Commonly known as:  MOBIC   vitamin E 1000 UNIT capsule Generic drug: vitamin E     TAKE these medications   atorvastatin 40 MG tablet Commonly known as: LIPITOR Take 1 tablet (40 mg total) by mouth daily at 6 PM.   B-COMPLEX/B-12 PO Take by mouth.   cholecalciferol 25 MCG (1000 UNIT) tablet Commonly known as: VITAMIN D3 Take 1,000 Units by mouth daily.   docusate sodium 100 MG capsule Commonly known as: COLACE Take 100 mg by mouth 2 (two) times daily.   famotidine 20 MG tablet Commonly known as: PEPCID Take 20 mg by mouth 2 (two) times daily.   flecainide 50 MG tablet Commonly known as: TAMBOCOR Take 50 mg by mouth 2 (two) times daily.   gabapentin 300 MG capsule Commonly known as: NEURONTIN Take 900 mg by mouth 3 (three) times daily.   metoprolol tartrate 25 MG tablet Commonly known as: LOPRESSOR Take 1 tablet (25 mg total) by mouth 2 (two) times daily.   nicotine 7 mg/24hr patch Commonly known as: NICODERM CQ - dosed in mg/24 hr Place 1 patch (7 mg total) onto the skin daily. Okay to substitute generic patch   pantoprazole 40 MG tablet Commonly known as: PROTONIX Take 1 tablet (40 mg total) by mouth daily.   potassium chloride 10 MEQ tablet Commonly known as: KLOR-CON Take 10 mEq by mouth daily.   senna 8.6 MG Tabs tablet Commonly known as: SENOKOT Take 8.6 mg by mouth 2 (two) times daily.   Senna-Plus 8.6-50 MG tablet Generic drug: senna-docusate Take 2 tablets by mouth 2 (two) times daily.   Tecfidera 240 MG Cpdr Generic drug: Dimethyl Fumarate Take 1 capsule by mouth 2 (two) times daily.   Xarelto 20 MG Tabs tablet Generic drug: rivaroxaban Take 20 mg by mouth at bedtime.       Allergies  Allergen Reactions  . Strawberry (Diagnostic) Swelling  . Carbamazepine Rash  . Carbamazepine Itching and Rash    Consultations:  Cardiology  GI    Procedures/Studies: DG Chest 2 View  Result Date: 11/03/2020 CLINICAL DATA:  Dizziness EXAM: CHEST - 2 VIEW  COMPARISON:  05/15/2018 FINDINGS: Small right-sided pleural effusion. No consolidation. Probable atelectasis right base. Mild cardiomegaly. No pneumothorax. IMPRESSION: Small right-sided pleural effusion with probable atelectasis at the right base. Mild cardiomegaly. Electronically Signed   By: Donavan Foil M.D.   On: 11/03/2020 16:31   CT ABDOMEN PELVIS W CONTRAST  Result Date: 11/03/2020 CLINICAL DATA:  54 year old female with upper abdominal pain and bloating. EXAM: CT ABDOMEN AND PELVIS WITH CONTRAST TECHNIQUE: Multidetector CT imaging of the abdomen and pelvis was performed using the standard protocol following bolus administration of intravenous contrast. CONTRAST:  115m OMNIPAQUE IOHEXOL 300 MG/ML  SOLN COMPARISON:  None. FINDINGS: Lower chest: Partially visualized small right pleural effusion. There is partial right lung base atelectasis versus infiltrate. There is cardiomegaly. There is dilatation of the right atrium with retrograde flow of contrast into  the IVC in keeping with a degree of right heart dysfunction. Correlation with echocardiogram recommended. No intra-abdominal free air. Small ascites. Hepatobiliary: Slight heterogeneous enhancement of the liver. Correlation with LFTs is recommended to evaluate for possibility of hepatitis. Early cirrhosis is not excluded. There is mild periportal edema. Cholecystectomy. No retained calcified stone noted in the central CBD. Pancreas: Unremarkable. No pancreatic ductal dilatation or surrounding inflammatory changes. Spleen: Normal in size without focal abnormality. Adrenals/Urinary Tract: The adrenal glands unremarkable. The kidneys, visualized ureters, and urinary bladder appear unremarkable. Stomach/Bowel: There are scattered colonic diverticula without active inflammatory changes. There is no bowel obstruction. There is mild stranding and edema surrounding the duodenal C-loop which may be extension of inflammatory process related to underlying liver  disease. Duodenitis is less likely. Clinical correlation is recommended. The appendix is normal. Vascular/Lymphatic: The abdominal aorta and IVC appear unremarkable. No portal venous gas. There is no adenopathy. Reproductive: Hysterectomy. Other: None Musculoskeletal: Lower lumbar facet arthropathy. No acute osseous pathology. IMPRESSION: 1. Heterogeneous enhancement of the liver with findings concerning for hepatitis. Correlation with clinical exam and LFTs recommended. 2. Inflammatory changes and stranding of the fat plane surrounding the duodenal C-loop, likely extension of the inflammatory process of the liver and less likely duodenitis. No bowel obstruction. Normal appendix. 3. Colonic diverticulosis. 4. Cardiomegaly with evidence of right heart dysfunction. Correlation with echocardiogram recommended. 5. Partially visualized small right pleural effusion with partial right lung base atelectasis versus infiltrate. Electronically Signed   By: Anner Crete M.D.   On: 11/03/2020 17:34   ECHOCARDIOGRAM COMPLETE  Result Date: 11/04/2020    ECHOCARDIOGRAM REPORT   Patient Name:   [name] [facility] Date of Exam: 11/04/2020 Medical Rec #:  [id number]      Height:       60.0 in Accession #:    [id number]     Weight:       165.0 lb Date of Birth:  10/24/1966      BSA:          1.720 m Patient Age:    55 years       BP:           127/79 mmHg Patient Gender: F              HR:           92 bpm. Exam Location:  [facility] Procedure: 2D Echo, Cardiac Doppler and Color Doppler Indications:     Syncope 780.2  History:         Patient has prior history of Echocardiogram examinations, most                  recent 04/14/2018. Stroke, Arrythmias:Atrial Fibrillation; Risk                  Factors:Hypertension.  Sonographer:     Sherrie Sport RDCS (AE) Referring Phys:  [id number] Harvie Bridge Diagnosing Phys: Kate Sable MD IMPRESSIONS  1. Left ventricular ejection fraction, by estimation, is 60 to 65%. The left ventricle has  normal function. The left ventricle has no regional wall motion abnormalities. There is mild left ventricular hypertrophy. Left ventricular diastolic parameters are indeterminate.  2. Right ventricular systolic function is normal. The right ventricular size is normal.  3. Left atrial size was severely dilated.  4. Right atrial size was moderately dilated.  5. The mitral valve is normal in structure. Mild mitral valve regurgitation.  6. Tricuspid valve regurgitation is moderate.  7. The aortic valve  is grossly normal. Aortic valve regurgitation is not visualized. FINDINGS  Left Ventricle: Left ventricular ejection fraction, by estimation, is 60 to 65%. The left ventricle has normal function. The left ventricle has no regional wall motion abnormalities. The left ventricular internal cavity size was normal in size. There is  mild left ventricular hypertrophy. Left ventricular diastolic parameters are indeterminate. Right Ventricle: The right ventricular size is normal. No increase in right ventricular wall thickness. Right ventricular systolic function is normal. Left Atrium: Left atrial size was severely dilated. Right Atrium: Right atrial size was moderately dilated. Pericardium: There is no evidence of pericardial effusion. Mitral Valve: The mitral valve is normal in structure. Mild mitral valve regurgitation. Tricuspid Valve: The tricuspid valve is normal in structure. Tricuspid valve regurgitation is moderate. Aortic Valve: The aortic valve is grossly normal. Aortic valve regurgitation is not visualized. Aortic valve mean gradient measures 2.0 mmHg. Aortic valve peak gradient measures 3.4 mmHg. Aortic valve area, by VTI measures 3.75 cm. Pulmonic Valve: The pulmonic valve was normal in structure. Pulmonic valve regurgitation is not visualized. Aorta: The aortic root is normal in size and structure. Venous: The inferior vena cava was not well visualized. IAS/Shunts: No atrial level shunt detected by color flow  Doppler.  LEFT VENTRICLE PLAX 2D LVIDd:         4.07 cm LVIDs:         2.74 cm LV PW:         1.23 cm LV IVS:        1.48 cm LVOT diam:     2.00 cm LV SV:         49 LV SV Index:   28 LVOT Area:     3.14 cm  RIGHT VENTRICLE RV Basal diam:  3.53 cm RV S prime:     11.90 cm/s TAPSE (M-mode): 3.0 cm LEFT ATRIUM              Index       RIGHT ATRIUM           Index LA diam:        5.40 cm  3.14 cm/m  RA Area:     20.30 cm LA Vol (A2C):   96.6 ml  56.16 ml/m RA Volume:   55.90 ml  32.50 ml/m LA Vol (A4C):   100.0 ml 58.14 ml/m LA Biplane Vol: 103.0 ml 59.88 ml/m  AORTIC VALVE                   PULMONIC VALVE AV Area (Vmax):    2.45 cm    PV Vmax:        0.44 m/s AV Area (Vmean):   2.41 cm    PV Peak grad:   0.8 mmHg AV Area (VTI):     3.75 cm    RVOT Peak grad: 3 mmHg AV Vmax:           92.80 cm/s AV Vmean:          59.500 cm/s AV VTI:            0.130 m AV Peak Grad:      3.4 mmHg AV Mean Grad:      2.0 mmHg LVOT Vmax:         72.40 cm/s LVOT Vmean:        45.700 cm/s LVOT VTI:          0.155 m LVOT/AV VTI ratio: 1.19  AORTA Ao Root diam: 3.00 cm  MITRAL VALVE                TRICUSPID VALVE MV Area (PHT): 4.10 cm     TR Peak grad:   45.2 mmHg MV Decel Time: 185 msec     TR Vmax:        336.00 cm/s MV E velocity: 104.00 cm/s                             SHUNTS                             Systemic VTI:  0.16 m                             Systemic Diam: 2.00 cm Kate Sable MD Electronically signed by Kate Sable MD Signature Date/Time: 11/04/2020/2:21:15 PM    Final        Subjective: Pt feels well today.  Currently no abdominal pain.  Tolerating diet.  No acute complaints.   Discharge Exam: Vitals:   11/05/20 0800 11/05/20 1202  BP: (!) 145/93 125/90  Pulse: 81 74  Resp: 18 16  Temp:  98.7 F (37.1 C)  SpO2: 97% 99%   Vitals:   11/05/20 0450 11/05/20 0500 11/05/20 0800 11/05/20 1202  BP: 118/75  (!) 145/93 125/90  Pulse: 61  81 74  Resp: _0 Temp: 98.6 F (37 C)   98.7  F (37.1 C)  TempSrc: Oral   Oral  SpO2: 95%  97% 99%  Weight:  76.8 kg    Height:        General: Pt is alert, awake, not in acute distress Cardiovascular: RRR, S1/S2 +, no rubs, no gallops Respiratory: CTA bilaterally, no wheezing, no rhonchi Abdominal: Soft, NT, ND, bowel sounds + Extremities: no edema, no cyanosis    The results of significant diagnostics from this hospitalization (including imaging, microbiology, ancillary and laboratory) are listed below for reference.     Microbiology: Recent Results (from the past 240 hour(s))  Resp Panel by RT-PCR (Flu A&B, Covid) Nasopharyngeal Swab     Status: None   Collection Time: 11/03/20  9:24 PM   Specimen: Nasopharyngeal Swab; Nasopharyngeal(NP) swabs in vial transport medium  Result Value Ref Range Status   SARS Coronavirus 2 by RT PCR NEGATIVE NEGATIVE Final    Comment: (NOTE) SARS-CoV-2 target nucleic acids are NOT DETECTED.  The SARS-CoV-2 RNA is generally detectable in upper respiratory specimens during the acute phase of infection. The lowest concentration of SARS-CoV-2 viral copies this assay can detect is 138 copies/mL. A negative result does not preclude SARS-Cov-2 infection and should not be used as the sole basis for treatment or other patient management decisions. A negative result may occur with  improper specimen collection/handling, submission of specimen other than nasopharyngeal swab, presence of viral mutation(s) within the areas targeted by this assay, and inadequate number of viral copies(<138 copies/mL). A negative result must be combined with clinical observations, patient history, and epidemiological information. The expected result is Negative.  Fact Sheet for Patients:  EntrepreneurPulse.com.au  Fact Sheet for Healthcare Providers:  IncredibleEmployment.be  This test is no t yet approved or cleared by the [place] FDA and  has been authorized for  detection and/or diagnosis of SARS-CoV-2 by FDA under an Emergency Use Authorization (EUA). This EUA will remain  in effect (meaning this test can be used) for the duration of the COVID-19 declaration under Section 564(b)(1) of the Act, 21 U.S.C.section 360bbb-3(b)(1), unless the authorization is terminated  or revoked sooner.       Influenza A by PCR NEGATIVE NEGATIVE Final   Influenza B by PCR NEGATIVE NEGATIVE Final    Comment: (NOTE) The Xpert Xpress SARS-CoV-2/FLU/RSV plus assay is intended as an aid in the diagnosis of influenza from Nasopharyngeal swab specimens and should not be used as a sole basis for treatment. Nasal washings and aspirates are unacceptable for Xpert Xpress SARS-CoV-2/FLU/RSV testing.  Fact Sheet for Patients: EntrepreneurPulse.com.au  Fact Sheet for Healthcare Providers: IncredibleEmployment.be  This test is not yet approved or cleared by the [place] FDA and has been authorized for detection and/or diagnosis of SARS-CoV-2 by FDA under an Emergency Use Authorization (EUA). This EUA will remain in effect (meaning this test can be used) for the duration of the COVID-19 declaration under Section 564(b)(1) of the Act, 21 U.S.C. section 360bbb-3(b)(1), unless the authorization is terminated or revoked.  Performed at [facility], [address] [place], [place] [address]      Labs: BNP (last 3 results) Recent Labs    11/03/20 2350  BNP 106.2*   Basic Metabolic Panel: Recent Labs  Lab 11/03/20 1126 11/03/20 2309 11/04/20 0333 11/05/20 0847  NA 141  --  143 140  K 4.1  --  3.4* 3.6  CL 108  --  109 102  CO2 24  --  25 28  GLUCOSE 107*  --  95 120*  BUN 27*  --  13 8  CREATININE 1.20*  --  0.60 0.59  CALCIUM 9.0  --  8.7* 9.3  MG  --  1.9  --   --   PHOS  --  3.2  --   --    Liver Function Tests: Recent Labs  Lab 11/03/20 1126 11/04/20 0333  AST 41 52*  ALT 49* 55*  ALKPHOS 93  116  BILITOT 0.9 0.7  PROT 7.3 7.4  ALBUMIN 3.8 3.9   Recent Labs  Lab 11/03/20 1126  LIPASE 31   No results for input(s): AMMONIA in the last 168 hours. CBC: Recent Labs  Lab 11/03/20 1126 11/04/20 0333  WBC 6.9 5.6  HGB 11.7* 11.2*  HCT 35.4* 33.9*  MCV 89.4 88.3  PLT 193 172   Cardiac Enzymes: No results for input(s): CKTOTAL, CKMB, CKMBINDEX, TROPONINI in the last 168 hours. BNP: Invalid input(s): POCBNP CBG: Recent Labs  Lab 11/05/20 0521  GLUCAP 86   D-Dimer No results for input(s): DDIMER in the last 72 hours. Hgb A1c No results for input(s): HGBA1C in the last 72 hours. Lipid Profile Recent Labs    11/03/20 2309  CHOL 120  HDL 52  LDLCALC 53  TRIG 73  CHOLHDL 2.3   Thyroid function studies Recent Labs    11/03/20 2309  TSH 9.052*   Anemia work up No results for input(s): VITAMINB12, FOLATE, FERRITIN, TIBC, IRON, RETICCTPCT in the last 72 hours. Urinalysis    Component Value Date/Time   COLORURINE AMBER (A) 11/03/2020 1055   APPEARANCEUR HAZY (A) 11/03/2020 1055   LABSPEC 1.028 11/03/2020 1055   PHURINE 5.0 11/03/2020 1055   GLUCOSEU NEGATIVE 11/03/2020 1055   HGBUR NEGATIVE 11/03/2020 [place] 11/03/2020 1055   KETONESUR NEGATIVE 11/03/2020 1055   PROTEINUR 100 (A) 11/03/2020 1055   NITRITE NEGATIVE 11/03/2020 1055   LEUKOCYTESUR NEGATIVE  11/03/2020 1055   Sepsis Labs Invalid input(s): PROCALCITONIN,  WBC,  LACTICIDVEN Microbiology Recent Results (from the past 240 hour(s))  Resp Panel by RT-PCR (Flu A&B, Covid) Nasopharyngeal Swab     Status: None   Collection Time: 11/03/20  9:24 PM   Specimen: Nasopharyngeal Swab; Nasopharyngeal(NP) swabs in vial transport medium  Result Value Ref Range Status   SARS Coronavirus 2 by RT PCR NEGATIVE NEGATIVE Final    Comment: (NOTE) SARS-CoV-2 target nucleic acids are NOT DETECTED.  The SARS-CoV-2 RNA is generally detectable in upper respiratory specimens during the acute  phase of infection. The lowest concentration of SARS-CoV-2 viral copies this assay can detect is 138 copies/mL. A negative result does not preclude SARS-Cov-2 infection and should not be used as the sole basis for treatment or other patient management decisions. A negative result may occur with  improper specimen collection/handling, submission of specimen other than nasopharyngeal swab, presence of viral mutation(s) within the areas targeted by this assay, and inadequate number of viral copies(<138 copies/mL). A negative result must be combined with clinical observations, patient history, and epidemiological information. The expected result is Negative.  Fact Sheet for Patients:  EntrepreneurPulse.com.au  Fact Sheet for Healthcare Providers:  IncredibleEmployment.be  This test is no t yet approved or cleared by the [place] FDA and  has been authorized for detection and/or diagnosis of SARS-CoV-2 by FDA under an Emergency Use Authorization (EUA). This EUA will remain  in effect (meaning this test can be used) for the duration of the COVID-19 declaration under Section 564(b)(1) of the Act, 21 U.S.C.section 360bbb-3(b)(1), unless the authorization is terminated  or revoked sooner.       Influenza A by PCR NEGATIVE NEGATIVE Final   Influenza B by PCR NEGATIVE NEGATIVE Final    Comment: (NOTE) The Xpert Xpress SARS-CoV-2/FLU/RSV plus assay is intended as an aid in the diagnosis of influenza from Nasopharyngeal swab specimens and should not be used as a sole basis for treatment. Nasal washings and aspirates are unacceptable for Xpert Xpress SARS-CoV-2/FLU/RSV testing.  Fact Sheet for Patients: EntrepreneurPulse.com.au  Fact Sheet for Healthcare Providers: IncredibleEmployment.be  This test is not yet approved or cleared by the [place] FDA and has been authorized for detection and/or diagnosis of  SARS-CoV-2 by FDA under an Emergency Use Authorization (EUA). This EUA will remain in effect (meaning this test can be used) for the duration of the COVID-19 declaration under Section 564(b)(1) of the Act, 21 U.S.C. section 360bbb-3(b)(1), unless the authorization is terminated or revoked.  Performed at [facility], [address] [place], [place] [address]      Time coordinating discharge: Over 30 minutes  SIGNED:   Ezekiel Slocumb, DO [facility] 11/05/2020, 2:12 PM   If 7PM-7AM, please contact night-coverage [email or web address]

## 2020-11-06 LAB — MITOCHONDRIAL ANTIBODIES: Mitochondrial M2 Ab, IgG: <20 Units (ref 0.0–20.0)

## 2020-11-06 LAB — ANTI-MICROSOMAL ANTIBODY LIVER / KIDNEY: LKM1 Ab: 1.3 Units (ref 0.0–20.0)

## 2020-11-06 LAB — ANTI-SMOOTH MUSCLE ANTIBODY, IGG: F-Actin IgG: 25 Units — ABNORMAL HIGH (ref 0–19)

## 2020-11-07 LAB — H. PYLORI ANTIGEN, STOOL: H. Pylori Stool Ag, Eia: NEGATIVE

## 2021-06-02 ENCOUNTER — Other Ambulatory Visit: Payer: Self-pay

## 2021-06-02 ENCOUNTER — Emergency Department
Admission: EM | Admit: 2021-06-02 | Discharge: 2021-06-02 | Disposition: A | Discharge status: Home / Self Care | Payer: Medicare HMO | Attending: Emergency Medicine | Admitting: Emergency Medicine

## 2021-06-02 DIAGNOSIS — I480 Paroxysmal atrial fibrillation: Secondary | ICD-10-CM | POA: Insufficient documentation

## 2021-06-02 DIAGNOSIS — Z8673 Personal history of transient ischemic attack (TIA), and cerebral infarction without residual deficits: Secondary | ICD-10-CM | POA: Diagnosis not present

## 2021-06-02 DIAGNOSIS — Z87820 Personal history of traumatic brain injury: Secondary | ICD-10-CM | POA: Diagnosis not present

## 2021-06-02 DIAGNOSIS — H547 Unspecified visual loss: Secondary | ICD-10-CM | POA: Diagnosis not present

## 2021-06-02 DIAGNOSIS — G4733 Obstructive sleep apnea (adult) (pediatric): Secondary | ICD-10-CM | POA: Diagnosis not present

## 2021-06-02 DIAGNOSIS — I10 Essential (primary) hypertension: Secondary | ICD-10-CM | POA: Insufficient documentation

## 2021-06-02 DIAGNOSIS — Z87891 Personal history of nicotine dependence: Secondary | ICD-10-CM | POA: Insufficient documentation

## 2021-06-02 DIAGNOSIS — Z79899 Other long term (current) drug therapy: Secondary | ICD-10-CM | POA: Insufficient documentation

## 2021-06-02 DIAGNOSIS — R030 Elevated blood-pressure reading, without diagnosis of hypertension: Secondary | ICD-10-CM | POA: Diagnosis present

## 2021-06-02 DIAGNOSIS — S069X0S Unspecified intracranial injury without loss of consciousness, sequela: Secondary | ICD-10-CM | POA: Diagnosis not present

## 2021-06-02 DIAGNOSIS — Z7901 Long term (current) use of anticoagulants: Secondary | ICD-10-CM | POA: Insufficient documentation

## 2021-06-02 DIAGNOSIS — H548 Legal blindness, as defined in USA: Secondary | ICD-10-CM | POA: Diagnosis not present

## 2021-06-02 DIAGNOSIS — G35 Multiple sclerosis: Secondary | ICD-10-CM | POA: Diagnosis not present

## 2021-06-02 DIAGNOSIS — R6889 Other general symptoms and signs: Secondary | ICD-10-CM | POA: Diagnosis not present

## 2021-06-02 DIAGNOSIS — R748 Abnormal levels of other serum enzymes: Secondary | ICD-10-CM | POA: Diagnosis not present

## 2021-06-02 DIAGNOSIS — R2689 Other abnormalities of gait and mobility: Secondary | ICD-10-CM | POA: Diagnosis not present

## 2021-06-02 LAB — URINALYSIS, COMPLETE (UACMP) WITH MICROSCOPIC
Bacteria, UA: NONE SEEN
Bilirubin Urine: NEGATIVE
Glucose, UA: 50 mg/dL — AB
Ketones, ur: NEGATIVE mg/dL
Leukocytes,Ua: NEGATIVE
Nitrite: NEGATIVE
Protein, ur: 100 mg/dL — AB
Specific Gravity, Urine: 1.005 (ref 1.005–1.030)
pH: 6 (ref 5.0–8.0)

## 2021-06-02 LAB — COMPREHENSIVE METABOLIC PANEL
ALT: 43 U/L (ref 0–44)
AST: 56 U/L — ABNORMAL HIGH (ref 15–41)
Albumin: 4.3 g/dL (ref 3.5–5.0)
Alkaline Phosphatase: 144 U/L — ABNORMAL HIGH (ref 38–126)
Anion gap: 11 (ref 5–15)
BUN: 26 mg/dL — ABNORMAL HIGH (ref 6–20)
CO2: 26 mmol/L (ref 22–32)
Calcium: 9.7 mg/dL (ref 8.9–10.3)
Chloride: 101 mmol/L (ref 98–111)
Creatinine, Ser: 1 mg/dL (ref 0.44–1.00)
GFR, Estimated: >60 mL/min (ref >60)
Glucose, Bld: 140 mg/dL — ABNORMAL HIGH (ref 70–99)
Potassium: 3.3 mmol/L — ABNORMAL LOW (ref 3.5–5.1)
Sodium: 138 mmol/L (ref 135–145)
Total Bilirubin: 1.2 mg/dL (ref 0.3–1.2)
Total Protein: 8.6 g/dL — ABNORMAL HIGH (ref 6.5–8.1)

## 2021-06-02 LAB — CBC WITH DIFFERENTIAL/PLATELET
Abs Immature Granulocytes: 0.02 10*3/uL (ref 0.00–0.07)
Basophils Absolute: 0 10*3/uL (ref 0.0–0.1)
Basophils Relative: 1 %
Eosinophils Absolute: 0.1 10*3/uL (ref 0.0–0.5)
Eosinophils Relative: 2 %
HCT: 37.9 % (ref 36.0–46.0)
Hemoglobin: 12.5 g/dL (ref 12.0–15.0)
Immature Granulocytes: 0 %
Lymphocytes Relative: 28 %
Lymphs Abs: 1.6 10*3/uL (ref 0.7–4.0)
MCH: 28.3 pg (ref 26.0–34.0)
MCHC: 33 g/dL (ref 30.0–36.0)
MCV: 85.9 fL (ref 80.0–100.0)
Monocytes Absolute: 0.5 10*3/uL (ref 0.1–1.0)
Monocytes Relative: 8 %
Neutro Abs: 3.6 10*3/uL (ref 1.7–7.7)
Neutrophils Relative %: 61 %
Platelets: 237 10*3/uL (ref 150–400)
RBC: 4.41 MIL/uL (ref 3.87–5.11)
RDW: 17.5 % — ABNORMAL HIGH (ref 11.5–15.5)
WBC: 5.9 10*3/uL (ref 4.0–10.5)
nRBC: 0 % (ref 0.0–0.2)

## 2021-06-02 MED ORDER — METOPROLOL TARTRATE 5 MG/5ML IV SOLN
5.0000 mg | Freq: Once | INTRAVENOUS | Status: AC
  Administered 2021-06-02: 5 mg via INTRAVENOUS
  Filled 2021-06-02: qty 5

## 2021-06-02 MED ORDER — METOPROLOL TARTRATE 25 MG PO TABS: 50.0000 mg | ORAL_TABLET | Freq: Two times a day (BID) | ORAL | 1 refills | Status: AC
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
## 2021-06-02 NOTE — ED Triage Notes (Signed)
Pt comes with c/o HTN. Pt states she was at her Neurologist appt for her MS. Pt states they said her BP was elevated and brought her over here. Pt denies any stroke like symptoms. Pt states she feels fine. Pt denies any pain.  Pt took BP meds today

## 2021-06-02 NOTE — ED Notes (Signed)
Patient to ED for high BP at Dr's office today. Has hx of stroke. No symptoms at this time.

## 2021-06-02 NOTE — Discharge Instructions (Addendum)
Please increase your metoprolol from one of the 25 mg pills twice a day to 2 of the 25 mg pills twice a day.  Please keep your appointment with physical therapy for tomorrow and have them check your blood pressure as they usually do.  Let them know that your blood pressure was over 190 mmHg systolic yesterday and you had to go to the emergency room before you start doing you physical therapy.  I am sure that they will send you up here to the emergency room for reevaluation if your blood pressure still very elevated.  It may take a few days to achieve the complete effect of taking the double dose of metoprolol.  Please follow-up with your regular doctor in the next week or so.  Please return here for any problems with weakness numbness headache chest pain or anything else.

## 2021-07-05 DIAGNOSIS — Z20822 Contact with and (suspected) exposure to covid-19: Secondary | ICD-10-CM | POA: Diagnosis not present

## 2021-07-05 DIAGNOSIS — Z03818 Encounter for observation for suspected exposure to other biological agents ruled out: Secondary | ICD-10-CM | POA: Diagnosis not present

## 2021-07-06 DIAGNOSIS — I639 Cerebral infarction, unspecified: Secondary | ICD-10-CM | POA: Diagnosis not present

## 2021-07-06 DIAGNOSIS — I10 Essential (primary) hypertension: Secondary | ICD-10-CM | POA: Diagnosis not present

## 2021-07-06 DIAGNOSIS — Z87891 Personal history of nicotine dependence: Secondary | ICD-10-CM | POA: Diagnosis not present

## 2021-07-06 DIAGNOSIS — Z0000 Encounter for general adult medical examination without abnormal findings: Secondary | ICD-10-CM | POA: Diagnosis not present

## 2021-07-06 DIAGNOSIS — G35 Multiple sclerosis: Secondary | ICD-10-CM | POA: Diagnosis not present

## 2021-07-14 DIAGNOSIS — H52223 Regular astigmatism, bilateral: Secondary | ICD-10-CM | POA: Diagnosis not present

## 2021-09-01 ENCOUNTER — Ambulatory Visit: Payer: Medicare HMO | Admitting: Adult Health

## 2021-09-28 IMAGING — MR MR HEAD WO/W CM
14 series · 48 of 48 positions shown · IV contrast (5ml Gadavist)
Comparison: Brain MRI 05/15/2018

CLINICAL DATA: Multiple sclerosis. Additional history provided by
scanning technologist: Diagnosis of multiple sclerosis approximately
0955, no new complaints or symptoms. Additional history obtained
from electronic medical record: History of hypertension.

EXAM:
MRI HEAD WITHOUT AND WITH CONTRAST
TECHNIQUE: Multiplanar, multiecho pulse sequences of the brain and surrounding
structures were obtained without and with intravenous contrast.
CONTRAST:  5mL GADAVIST GADOBUTROL 1 MMOL/ML IV SOLN

[Series 9: ax dwi_tracew · axial · 3.0mm · 0.60mm/px · z∈[-78,+78]mm · 4 of 47 slices shown]
[im 1/47]
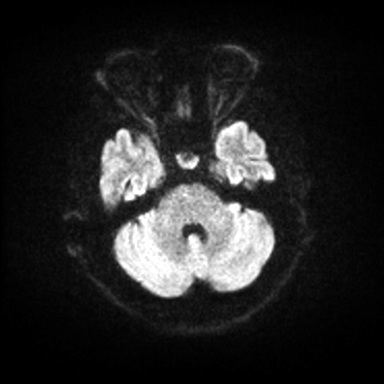
[im 16/47]
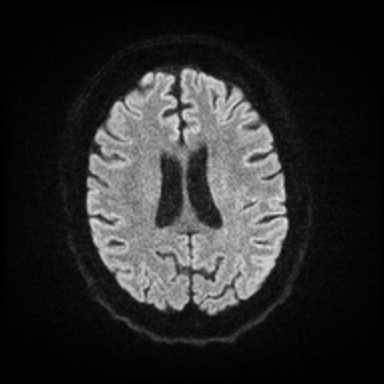
[im 31/47]
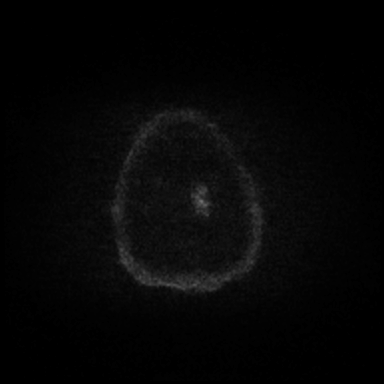
[im 47/47]
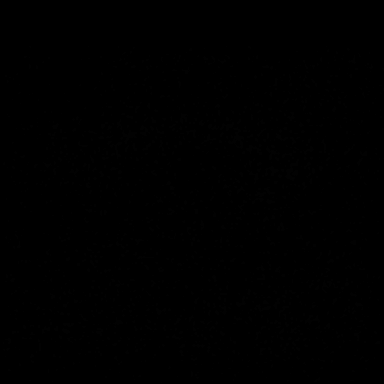

[Series 10: ax dwi_adc · axial · 3.0mm · 0.60mm/px · z∈[-78,+35]mm · 2 of 35 slices shown]
[im 1/35]
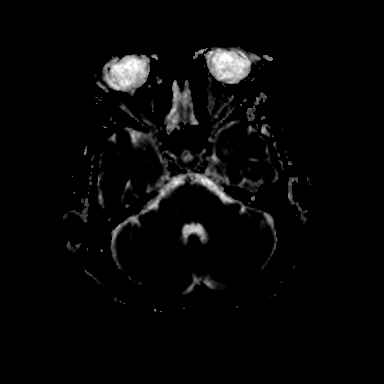
[im 35/35]
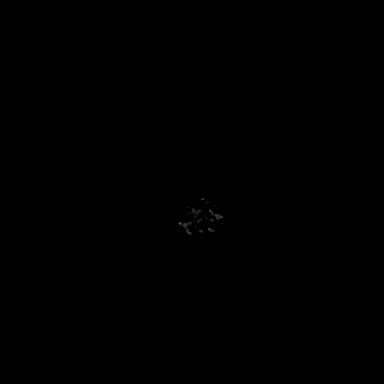

[Series 11: cor dwi_tracew · coronal · 5.0mm · 0.60mm/px · 2 of 38 slices shown]
[im 1/38]
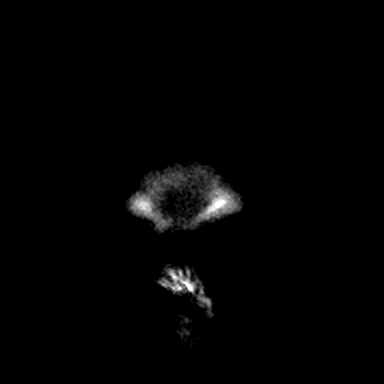
[im 38/38]
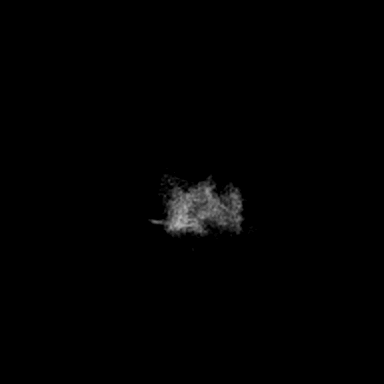

[Series 12: cor dwi_adc · coronal · 5.0mm · 0.60mm/px · 2 of 38 slices shown]
[im 1/38]
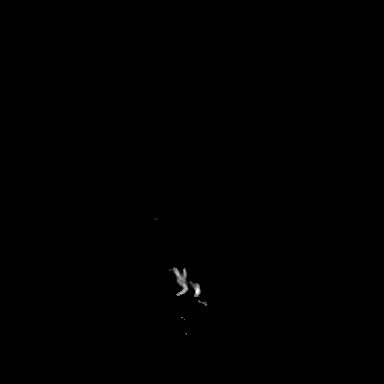
[im 38/38]
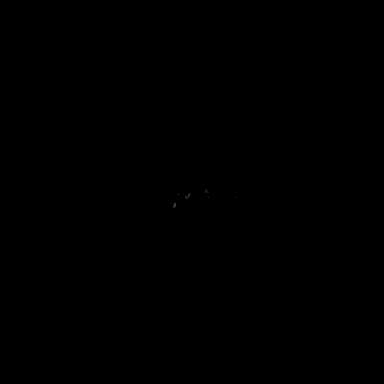

[Series 13: T1 · sagittal · 5.0mm · 0.62mm/px · 1 of 23 slices shown (1 of 2)]
[im 1/23]
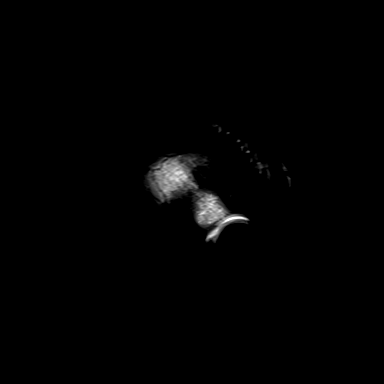

[Series 14: T2 · axial · 5.0mm · 0.53mm/px · 1 of 25 slices shown]
[im 1/25]
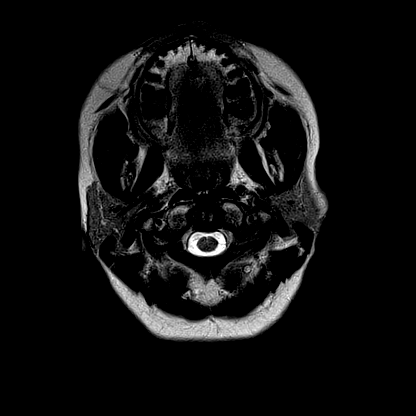

[Series 15: mag_images · axial · 3.0mm · 0.90mm/px · z∈[-152,+24]mm · 4 of 60 slices shown]
[im 1/60]
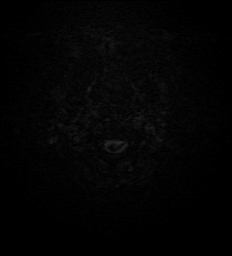
[im 20/60]
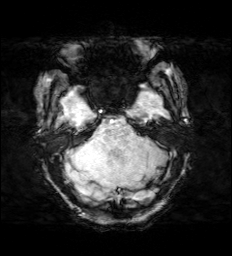
[im 40/60]
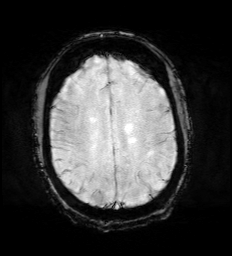
[im 60/60]
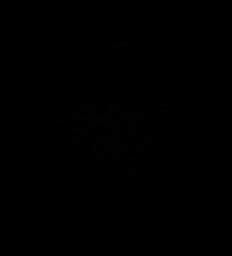

[Series 16: pha_images · axial · 3.0mm · 0.90mm/px · z∈[-149,+24]mm · 3 of 57 slices shown]
[im 1/57]
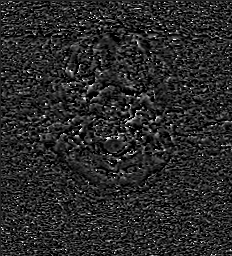
[im 29/57]
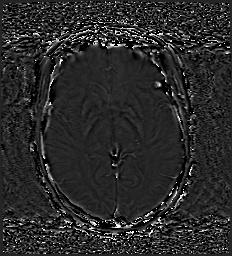
[im 57/57]
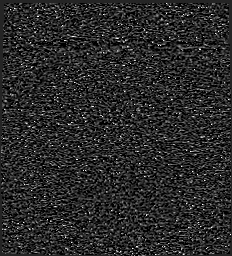

[Series 17: swi_images · axial · 3.0mm · 0.90mm/px · z∈[-152,+24]mm · 4 of 60 slices shown]
[im 1/60]
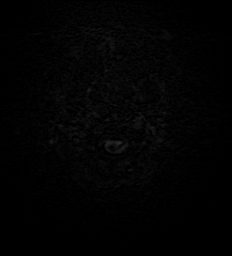
[im 20/60]
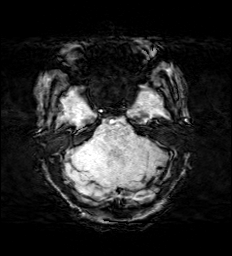
[im 40/60]
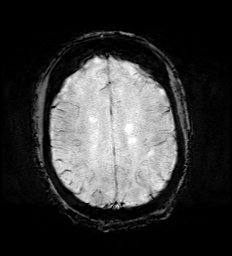
[im 60/60]
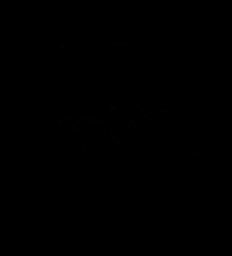

[Series 19: FLAIR · axial · 3.0mm · 0.53mm/px · z∈[-144,+17]mm · 3 of 55 slices shown]
[im 1/55]
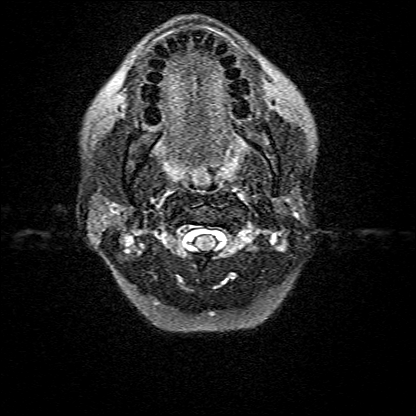
[im 28/55]
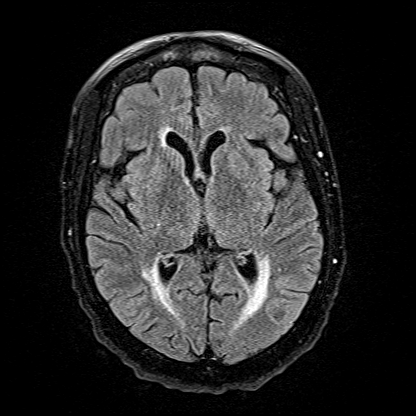
[im 55/55]
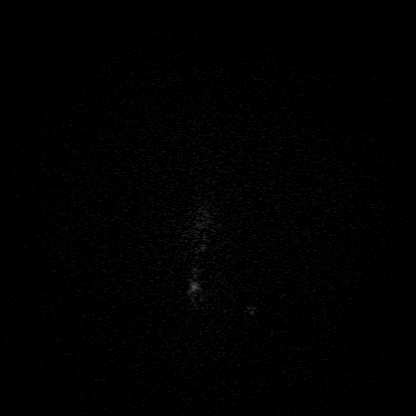

[Series 20: T1 · axial · 1.0mm · 0.98mm/px · z∈[-153,+3]mm · 9 of 160 slices shown (2 of 2)]
[im 1/160]
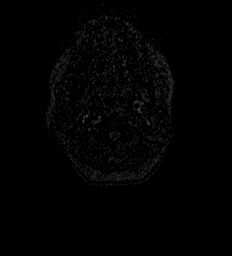
[im 20/160]
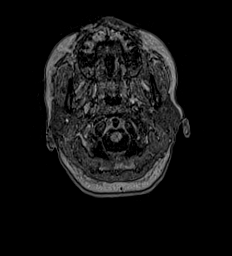
[im 40/160]
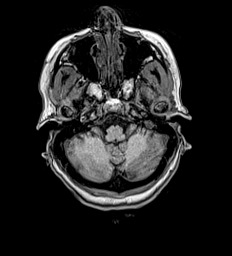
[im 60/160]
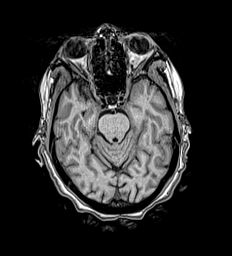
[im 80/160]
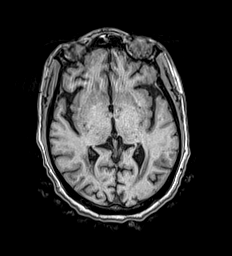
[im 100/160]
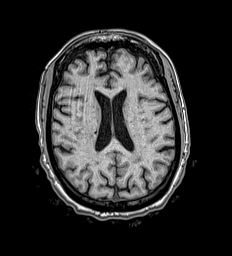
[im 120/160]
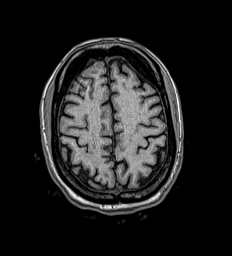
[im 140/160]
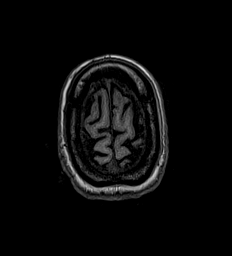
[im 160/160]
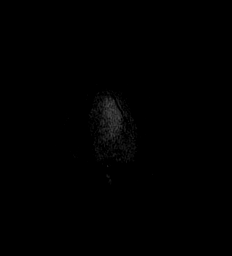

[Series 21: T2 post-contrast · coronal · 5.0mm · 0.57mm/px · 2 of 29 slices shown]
[im 1/29]
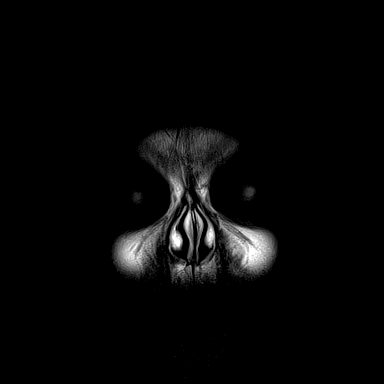
[im 29/29]
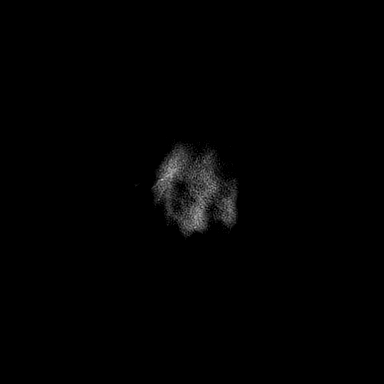

[Series 22: T1 post-contrast · axial · 1.0mm · 0.98mm/px · z∈[-153,+3]mm · 9 of 160 slices shown (1 of 2)]
[im 1/160]
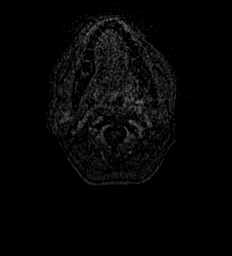
[im 20/160]
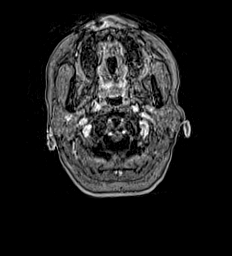
[im 40/160]
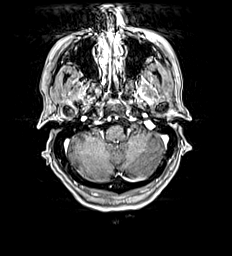
[im 60/160]
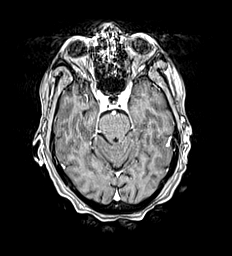
[im 80/160]
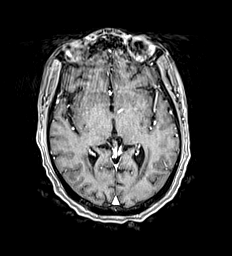
[im 100/160]
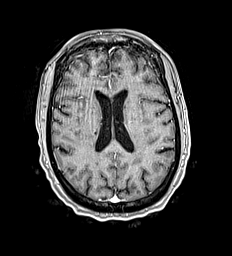
[im 120/160]
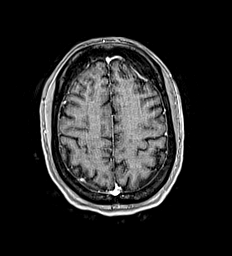
[im 140/160]
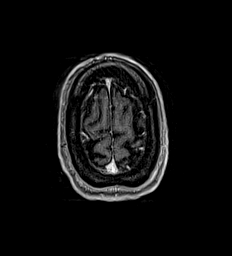
[im 160/160]
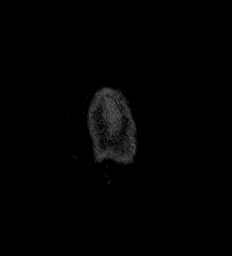

[Series 23: T1 post-contrast · coronal · 5.0mm · 0.57mm/px · 2 of 29 slices shown (2 of 2)]
[im 1/29]
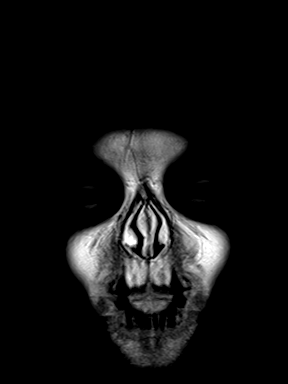
[im 29/29]
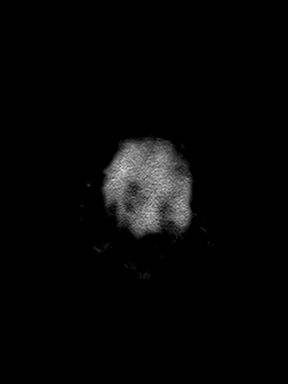

[48 of 48 positions shown; findings below may reference images not displayed]

FINDINGS: Brain:

The examination is intermittently motion degraded. This includes
mild motion degradation of the axial T1 weighted postcontrast
sequence.

Redemonstrated multifocal T2/FLAIR hyperintensity within the
subcortical/juxtacortical and deep periventricular white matter. No
definitively new lesion is identified. No abnormal intracranial
enhancement is demonstrated to suggest active demyelination.

Redemonstrated foci of T2 hyperintensity within the basal ganglia,
right thalamus and right pons, likely reflecting chronic lacunar
infarcts.

As before, there are chronic microhemorrhages within the left
thalamus.

There is no acute infarct.

No evidence of intracranial mass.

No extra-axial fluid collection.

No midline shift.

Vascular: Expected proximal arterial flow voids.

Skull and upper cervical spine: No focal marrow lesion.

Sinuses/Orbits: Visualized orbits show no acute finding. Trace
ethmoid sinus mucosal thickening. No significant mastoid effusion.
IMPRESSION: Mildly motion degraded examination.

Redemonstrated multifocal T2 hyperintense signal changes within the
cerebral white matter, consistent with the provided history of
multiple sclerosis. No new white matter lesion is identified. No
abnormal intracranial enhancement is demonstrated to suggest active
demyelination.

Unchanged chronic lacunar infarcts within the basal ganglia, right
thalamus and right pons.

## 2022-01-18 ENCOUNTER — Encounter: Payer: Self-pay | Admitting: Emergency Medicine

## 2022-01-18 ENCOUNTER — Other Ambulatory Visit: Payer: Self-pay

## 2022-01-18 ENCOUNTER — Observation Stay
Admission: EM | Admit: 2022-01-18 | Discharge: 2022-01-19 | Disposition: A | Discharge status: Home / Self Care | Payer: Medicare (Managed Care) | Attending: Student in an Organized Health Care Education/Training Program | Admitting: Student in an Organized Health Care Education/Training Program

## 2022-01-18 DIAGNOSIS — G35 Multiple sclerosis: Secondary | ICD-10-CM | POA: Diagnosis present

## 2022-01-18 DIAGNOSIS — I480 Paroxysmal atrial fibrillation: Secondary | ICD-10-CM | POA: Diagnosis present

## 2022-01-18 DIAGNOSIS — Z20822 Contact with and (suspected) exposure to covid-19: Secondary | ICD-10-CM | POA: Diagnosis not present

## 2022-01-18 DIAGNOSIS — I639 Cerebral infarction, unspecified: Secondary | ICD-10-CM | POA: Diagnosis present

## 2022-01-18 DIAGNOSIS — E785 Hyperlipidemia, unspecified: Secondary | ICD-10-CM | POA: Diagnosis present

## 2022-01-18 DIAGNOSIS — Z7901 Long term (current) use of anticoagulants: Secondary | ICD-10-CM | POA: Diagnosis not present

## 2022-01-18 DIAGNOSIS — I160 Hypertensive urgency: Principal | ICD-10-CM | POA: Diagnosis present

## 2022-01-18 DIAGNOSIS — Z87891 Personal history of nicotine dependence: Secondary | ICD-10-CM | POA: Diagnosis not present

## 2022-01-18 DIAGNOSIS — I10 Essential (primary) hypertension: Secondary | ICD-10-CM | POA: Diagnosis present

## 2022-01-18 DIAGNOSIS — Z8673 Personal history of transient ischemic attack (TIA), and cerebral infarction without residual deficits: Secondary | ICD-10-CM | POA: Diagnosis not present

## 2022-01-18 DIAGNOSIS — H548 Legal blindness, as defined in USA: Secondary | ICD-10-CM | POA: Diagnosis present

## 2022-01-18 DIAGNOSIS — Z79899 Other long term (current) drug therapy: Secondary | ICD-10-CM | POA: Diagnosis not present

## 2022-01-18 DIAGNOSIS — R42 Dizziness and giddiness: Secondary | ICD-10-CM | POA: Diagnosis present

## 2022-01-18 LAB — RESP PANEL BY RT-PCR (FLU A&B, COVID) ARPGX2
Influenza A by PCR: NEGATIVE
Influenza B by PCR: NEGATIVE
SARS Coronavirus 2 by RT PCR: NEGATIVE

## 2022-01-18 LAB — BASIC METABOLIC PANEL
Anion gap: 11 (ref 5–15)
BUN: 15 mg/dL (ref 6–20)
CO2: 30 mmol/L (ref 22–32)
Calcium: 10.2 mg/dL (ref 8.9–10.3)
Chloride: 101 mmol/L (ref 98–111)
Creatinine, Ser: 0.78 mg/dL (ref 0.44–1.00)
GFR, Estimated: >60 mL/min (ref >60)
Glucose, Bld: 93 mg/dL (ref 70–99)
Potassium: 3.5 mmol/L (ref 3.5–5.1)
Sodium: 142 mmol/L (ref 135–145)

## 2022-01-18 LAB — CBC
HCT: 41.7 % (ref 36.0–46.0)
Hemoglobin: 13.7 g/dL (ref 12.0–15.0)
MCH: 29.7 pg (ref 26.0–34.0)
MCHC: 32.9 g/dL (ref 30.0–36.0)
MCV: 90.3 fL (ref 80.0–100.0)
Platelets: 216 10*3/uL (ref 150–400)
RBC: 4.62 MIL/uL (ref 3.87–5.11)
RDW: 13.3 % (ref 11.5–15.5)
WBC: 7.1 10*3/uL (ref 4.0–10.5)
nRBC: 0 % (ref 0.0–0.2)

## 2022-01-18 LAB — HIV ANTIBODY (ROUTINE TESTING W REFLEX): HIV Screen 4th Generation wRfx: NONREACTIVE

## 2022-01-18 MED ORDER — HYDRALAZINE HCL 20 MG/ML IJ SOLN: 10.0000 mg | INTRAMUSCULAR | Status: DC | PRN

## 2022-01-18 MED ORDER — METOPROLOL TARTRATE 25 MG PO TABS
25.0000 mg | ORAL_TABLET | Freq: Two times a day (BID) | ORAL | Status: DC
  Administered 2022-01-18 – 2022-01-19 (×2): 25 mg via ORAL
  Filled 2022-01-18 (×2): qty 1

## 2022-01-18 MED ORDER — LOSARTAN POTASSIUM 50 MG PO TABS
50.0000 mg | ORAL_TABLET | Freq: Every day | ORAL | Status: DC
  Administered 2022-01-18 – 2022-01-19 (×2): 50 mg via ORAL
  Filled 2022-01-18 (×2): qty 1

## 2022-01-18 MED ORDER — ONDANSETRON HCL 4 MG/2ML IJ SOLN: 4.0000 mg | Freq: Three times a day (TID) | INTRAMUSCULAR | Status: DC | PRN

## 2022-01-18 MED ORDER — AMLODIPINE BESYLATE 5 MG PO TABS: 5.0000 mg | ORAL_TABLET | Freq: Every day | ORAL | Status: DC

## 2022-01-18 MED ORDER — RENA-VITE PO TABS
1.0000 | ORAL_TABLET | Freq: Every day | ORAL | Status: DC
  Administered 2022-01-19: 1 via ORAL
  Filled 2022-01-18: qty 1

## 2022-01-18 MED ORDER — GABAPENTIN 300 MG PO CAPS
900.0000 mg | ORAL_CAPSULE | Freq: Three times a day (TID) | ORAL | Status: DC
  Filled 2022-01-18 (×2): qty 3

## 2022-01-18 MED ORDER — HYDROCHLOROTHIAZIDE 12.5 MG PO TABS
12.5000 mg | ORAL_TABLET | Freq: Every day | ORAL | Status: DC
  Administered 2022-01-18: 12.5 mg via ORAL
  Filled 2022-01-18: qty 1

## 2022-01-18 MED ORDER — LOSARTAN POTASSIUM 50 MG PO TABS
50.0000 mg | ORAL_TABLET | Freq: Once | ORAL | Status: AC
  Administered 2022-01-18: 50 mg via ORAL
  Filled 2022-01-18: qty 1

## 2022-01-18 MED ORDER — VITAMIN D 25 MCG (1000 UNIT) PO TABS
1000.0000 [IU] | ORAL_TABLET | Freq: Every day | ORAL | Status: DC
  Administered 2022-01-19: 1000 [IU] via ORAL
  Filled 2022-01-18: qty 1

## 2022-01-18 MED ORDER — ENOXAPARIN SODIUM 40 MG/0.4ML IJ SOSY: 40.0000 mg | PREFILLED_SYRINGE | INTRAMUSCULAR | Status: DC

## 2022-01-18 MED ORDER — HYDROCHLOROTHIAZIDE 25 MG PO TABS
25.0000 mg | ORAL_TABLET | Freq: Every day | ORAL | Status: DC
  Administered 2022-01-19: 25 mg via ORAL
  Filled 2022-01-18: qty 1

## 2022-01-18 MED ORDER — ATORVASTATIN CALCIUM 20 MG PO TABS: 40.0000 mg | ORAL_TABLET | Freq: Every day | ORAL | Status: DC

## 2022-01-18 MED ORDER — RIVAROXABAN 20 MG PO TABS
20.0000 mg | ORAL_TABLET | Freq: Every day | ORAL | Status: DC
  Administered 2022-01-18: 20 mg via ORAL
  Filled 2022-01-18 (×2): qty 1

## 2022-01-18 MED ORDER — SENNOSIDES-DOCUSATE SODIUM 8.6-50 MG PO TABS
2.0000 | ORAL_TABLET | Freq: Two times a day (BID) | ORAL | Status: DC
  Administered 2022-01-18: 2 via ORAL
  Filled 2022-01-18: qty 2

## 2022-01-18 MED ORDER — HYDRALAZINE HCL 20 MG/ML IJ SOLN
5.0000 mg | INTRAMUSCULAR | Status: DC | PRN
  Administered 2022-01-19 (×2): 5 mg via INTRAVENOUS
  Filled 2022-01-18 (×2): qty 1

## 2022-01-18 MED ORDER — AMLODIPINE BESYLATE 5 MG PO TABS
5.0000 mg | ORAL_TABLET | Freq: Every day | ORAL | Status: DC
Start: 2022-01-18 — End: 2022-01-19
  Administered 2022-01-19: 5 mg via ORAL
  Filled 2022-01-18: qty 1

## 2022-01-18 MED ORDER — FLECAINIDE ACETATE 100 MG PO TABS
100.0000 mg | ORAL_TABLET | Freq: Every day | ORAL | Status: DC
  Administered 2022-01-19: 100 mg via ORAL
  Filled 2022-01-18: qty 1

## 2022-01-18 MED ORDER — LABETALOL HCL 5 MG/ML IV SOLN
10.0000 mg | Freq: Once | INTRAVENOUS | Status: AC
  Administered 2022-01-18: 10 mg via INTRAVENOUS
  Filled 2022-01-18: qty 4

## 2022-01-18 MED ORDER — HYDRALAZINE HCL 20 MG/ML IJ SOLN
10.0000 mg | Freq: Once | INTRAMUSCULAR | Status: AC
  Administered 2022-01-18: 10 mg via INTRAVENOUS
  Filled 2022-01-18: qty 1

## 2022-01-18 MED ORDER — PANTOPRAZOLE SODIUM 40 MG PO TBEC
40.0000 mg | DELAYED_RELEASE_TABLET | Freq: Every day | ORAL | Status: DC
  Administered 2022-01-19 (×2): 40 mg via ORAL
  Filled 2022-01-18 (×2): qty 1

## 2022-01-18 MED ORDER — DIMETHYL FUMARATE 240 MG PO CPDR: 1.0000 | DELAYED_RELEASE_CAPSULE | Freq: Two times a day (BID) | ORAL | Status: DC

## 2022-01-18 MED ORDER — ACETAMINOPHEN 325 MG PO TABS
650.0000 mg | ORAL_TABLET | Freq: Four times a day (QID) | ORAL | Status: DC | PRN
  Administered 2022-01-18 – 2022-01-19 (×2): 650 mg via ORAL
  Filled 2022-01-18 (×2): qty 2

## 2022-01-18 NOTE — ED Provider Notes (Signed)
[facility] Provider Note    Event Date/Time   First MD Initiated Contact with Patient 01/18/22 1336     (approximate)   History   Hypertension   HPI  [name] is a 56 y.o. female who reports her blood pressures been up for several weeks.  It was 196 last week.  She called her doctor today when it was over 200 and her doctor told her to come here.  She had been here previously and July for similar problem and we increased her metoprolol that she was taking but it developed that she could not exercise with a higher dose of metoprolol so they decreased it back to where it had been.  They put her on losartan HCTZ.  She has been taking it and took it this morning.  Her blood pressure this morning was still very high though.      Physical Exam   Triage Vital Signs: ED Triage Vitals  Enc Vitals Group     BP 01/18/22 1305 (!) 224/124     Pulse Rate 01/18/22 1305 78     Resp 01/18/22 1305 18     Temp 01/18/22 1305 98.7 F (37.1 C)     Temp Source 01/18/22 1305 Oral     SpO2 01/18/22 1305 97 %     Weight 01/18/22 1303 152 lb (68.9 kg)     Height 01/18/22 1303 5' (1.524 m)     Head Circumference --      Peak Flow --      Pain Score --      Pain Loc --      Pain Edu? --      Excl. in GC? --     Most recent vital signs: Vitals:   01/18/22 1443 01/18/22 1516  BP: (!) 196/101 (!) 216/116  Pulse:  72  Resp:  17  Temp:    SpO2:  98%     General: Awake, no distress. Eyes: Fundi with no obvious hemorrhage but changes consistent with retinitis pigmentosa :CV:  Good peripheral perfusion.  Heart regular rate and rhythm no audible murmurs Resp:  Normal effort.  Lungs are clear Abd:  No distention.     ED Results / Procedures / Treatments   Labs (all labs ordered are listed, but only abnormal results are displayed) Labs Reviewed  RESP PANEL BY RT-PCR (FLU A&B, COVID) [id number]  CBC  BASIC METABOLIC PANEL     EKG  EKG read interpreted by  me shows A-fib at a rate of 91 normal axis no acute ST-T wave changes   RADIOLOGY    PROCEDURES:  Critical Care performed:   Procedures   MEDICATIONS ORDERED IN ED: Medications  hydrochlorothiazide (HYDRODIURIL) tablet 12.5 mg (12.5 mg Oral Given 01/18/22 1403)  labetalol (NORMODYNE) injection 10 mg (has no administration in time range)  losartan (COZAAR) tablet 50 mg (50 mg Oral Given 01/18/22 1403)     IMPRESSION / MDM / ASSESSMENT AND PLAN / ED COURSE  I reviewed the triage vital signs and the nursing notes. Patient's blood pressure was trending downward but now has gone back up to 216 systolic I have spoken with the hospitalist he will admit this patient we will try to get the blood pressure down with some IV medication and see if he can get it stabilized.  We will get some lab work as needed to evaluate her.  FINAL CLINICAL IMPRESSION(S) / ED DIAGNOSES   Final diagnoses:  Severe  hypertension   Actual diagnosis should be something more like extreme hypertension.  Rx / DC Orders   ED Discharge Orders     None        Note:  This document was prepared using Dragon voice recognition software and may include unintentional dictation errors.   Arnaldo Natal, MD 01/18/22 1550

## 2022-01-18 NOTE — ED Triage Notes (Addendum)
[facility] due to elevated b/p 210/145, pt denies having any sx Pt is visually impaired and uses a walking cane

## 2022-01-18 NOTE — ED Notes (Signed)
See triage note  presents with elevated b/p  states her b/p has been up for weeks  is currently taking her meds

## 2022-01-18 NOTE — H&P (Signed)
History and Physical    [name] [id number] DOB: May 05, 1966 DOA: 01/18/2022  Referring MD/NP/PA:   PCP: Martyn Malay, DO   Patient coming from:  The [facility]  Chief Complaint: Elevated blood pressure  HPI: [name] is a 56 y.o. female with medical history significant of hypertension, hyperlipidemia, stroke, GERD, anxiety, multiple sclerosis, atrial fibrillation on Xarelto, legally blindness, who presents with elevated blood pressure.  Pt states that her was has not been controlled in the past several weeks. She states that had been here previously for similar problem. Her metoprolol dose was increased from 25 to 50 mg twice daily, but she could not tolerate high dose of metoprolol, therefore metoprolol dose was decreased back to 20 mg twice daily. Her Bp was over 200 mmHg today. She called her doctor who told her to come here.  Patient denies any chest pain, cough, shortness of breath.  No nausea, vomiting, diarrhea or abdominal pain.  No symptoms of UTI.  Patient states that sometimes she had mild dizziness, but currently no dizziness.  She states that her multiple sclerosis is controlled. Patient was found to have blood pressure 224/124 in ED which improved to 206/111 after giving 10 mg of IV labetalol.  Data Reviewed and ED Course: pt was found to have pending COVID PCR, electrolytes renal function okay, WBC 7.1. Blood pressure 224/124, 216/116, heart rate 78, RR 18, oxygen saturation 98% on room air, normal temperature.  Patient is placed on telemetry bed for observation  EKG: I have personally reviewed.  Sinus rhythm, QTc 472, possible left atrial enlargement, left axis deviation, T wave inversion in lead I/aVL.   Review of Systems:   General: no fevers, chills, no body weight gain, fatigue HEENT: Legally blindness, hearing changes or sore throat Respiratory: no dyspnea, coughing, wheezing CV: no chest pain, no palpitations GI: no  nausea, vomiting, abdominal pain, diarrhea, constipation GU: no dysuria, burning on urination, increased urinary frequency, hematuria  Ext: no leg edema Neuro: no unilateral weakness, numbness, or tingling, no vision change or hearing loss. Has dizziness Skin: no rash, no skin tear. MSK: No muscle spasm, no deformity, no limitation of range of movement in spin Heme: No easy bruising.  Travel history: No recent long distant travel.   Allergy:  Allergies  Allergen Reactions   Strawberry (Diagnostic) Swelling   Carbamazepine Rash   Carbamazepine Itching and Rash    Past Medical History:  Diagnosis Date   A-fib ([place])    Hypertension    Legally blind    Multiple sclerosis ([place]) 2001   Multiple sclerosis ([place])    Retinitis pigmentosa 2007   Stroke [facility])     Past Surgical History:  Procedure Laterality Date   ABDOMINAL HYSTERECTOMY     COLONOSCOPY WITH PROPOFOL N/A 07/03/2020   Procedure: COLONOSCOPY WITH PROPOFOL;  Surgeon: Lesly Rubenstein, MD;  Location: [facility] ENDOSCOPY;  Service: Endoscopy;  Laterality: N/A;   CYST REMOVAL NECK     spine    Social History:  reports that she quit smoking about 3 years ago. Her smoking use included cigarettes. She has a 9.90 pack-year smoking history. She has never used smokeless tobacco. She reports that she does not drink alcohol and does not use drugs.  Family History:  Family History  Problem Relation Age of Onset   Hypertension Mother    Stroke Father    Hypertension Father      Prior to Admission medications   Medication Sig  Start Date End Date Taking? Authorizing Provider  atorvastatin (LIPITOR) 40 MG tablet Take 1 tablet (40 mg total) by mouth daily at 6 PM. 05/17/18   Alford Highland, MD  B Complex Vitamins (B-COMPLEX/B-12 PO) Take by mouth.    [provider]  cholecalciferol (VITAMIN D3) 25 MCG (1000 UNIT) tablet Take 1,000 Units by mouth daily.    [provider]  Dimethyl Fumarate (TECFIDERA) 240 MG CPDR  Take 1 capsule by mouth 2 (two) times daily.    [provider]  docusate sodium (COLACE) 100 MG capsule Take 100 mg by mouth 2 (two) times daily.    [provider]  famotidine (PEPCID) 20 MG tablet Take 20 mg by mouth 2 (two) times daily. 10/29/20   [provider]  flecainide (TAMBOCOR) 50 MG tablet Take 50 mg by mouth 2 (two) times daily. 10/29/20   [provider]  gabapentin (NEURONTIN) 300 MG capsule Take 900 mg by mouth 3 (three) times daily.     [provider]  metoprolol tartrate (LOPRESSOR) 25 MG tablet Take 1 tablet (25 mg total) by mouth 2 (two) times daily. 11/05/20 01/04/21  Esaw Grandchild A, DO  metoprolol tartrate (LOPRESSOR) 25 MG tablet Take 2 tablets (50 mg total) by mouth 2 (two) times daily. Be sure to follow-up with your doctor and get more refills if this is working. 06/02/21 06/02/22  Arnaldo Natal, MD  nicotine (NICODERM CQ - DOSED IN MG/24 HR) 7 mg/24hr patch Place 1 patch (7 mg total) onto the skin daily. Okay to substitute generic patch 11/05/20   Esaw Grandchild A, DO  pantoprazole (PROTONIX) 40 MG tablet Take 1 tablet (40 mg total) by mouth daily. 11/05/20   Pennie Banter, DO  potassium chloride (KLOR-CON) 10 MEQ tablet Take 10 mEq by mouth daily. 10/29/20   [provider]  senna (SENOKOT) 8.6 MG TABS tablet Take 8.6 mg by mouth 2 (two) times daily. 05/14/18   [provider]  SENNA-PLUS 8.6-50 MG tablet Take 2 tablets by mouth 2 (two) times daily. 10/29/20   [provider]  XARELTO 20 MG TABS tablet Take 20 mg by mouth at bedtime. 10/29/20   [provider]    Physical Exam: Vitals:   01/18/22 1403 01/18/22 1443 01/18/22 1516 01/18/22 1700  BP: (!) 194/108 (!) 196/101 (!) 216/116 (!) 162/87  Pulse:   72 76  Resp:   17   Temp:      TempSrc:      SpO2:   98% 100%  Weight:      Height:       General: Not in acute distress HEENT:       Eyes: Legally blindness.  No scleral  icterus.       ENT: No discharge from the ears and nose, no pharynx injection, no tonsillar enlargement.        Neck: No JVD, no bruit, no mass felt. Heme: No neck lymph node enlargement. Cardiac: S1/S2, RRR, No murmurs, No gallops or rubs. Respiratory: No rales, wheezing, rhonchi or rubs. GI: Soft, nondistended, nontender, no rebound pain, no organomegaly, BS present. GU: No hematuria Ext: No pitting leg edema bilaterally. 1+DP/PT pulse bilaterally. Musculoskeletal: No joint deformities, No joint redness or warmth, no limitation of ROM in spin. Skin: No rashes.  Neuro: Alert, oriented X3, cranial nerves II-XII grossly except for poor vision, moves all extremities. Psych: Patient is not psychotic, no suicidal or hemocidal ideation.  Labs on Admission: I have personally  reviewed following labs and imaging studies  CBC: Recent Labs  Lab 01/18/22 1607  WBC 7.1  HGB 13.7  HCT 41.7  MCV 90.3  PLT 123XX123   Basic Metabolic Panel: Recent Labs  Lab 01/18/22 1607  NA 142  K 3.5  CL 101  CO2 30  GLUCOSE 93  BUN 15  CREATININE 0.78  CALCIUM 10.2   GFR: Estimated Creatinine Clearance: 67.2 mL/min (by C-G formula based on SCr of 0.78 mg/dL). Liver Function Tests: No results for input(s): AST, ALT, ALKPHOS, BILITOT, PROT, ALBUMIN in the last 168 hours. No results for input(s): LIPASE, AMYLASE in the last 168 hours. No results for input(s): AMMONIA in the last 168 hours. Coagulation Profile: No results for input(s): INR, PROTIME in the last 168 hours. Cardiac Enzymes: No results for input(s): CKTOTAL, CKMB, CKMBINDEX, TROPONINI in the last 168 hours. BNP (last 3 results) No results for input(s): PROBNP in the last 8760 hours. HbA1C: No results for input(s): HGBA1C in the last 72 hours. CBG: No results for input(s): GLUCAP in the last 168 hours. Lipid Profile: No results for input(s): CHOL, HDL, LDLCALC, TRIG, CHOLHDL, LDLDIRECT in the last 72 hours. Thyroid Function Tests: No  results for input(s): TSH, T4TOTAL, FREET4, T3FREE, THYROIDAB in the last 72 hours. Anemia Panel: No results for input(s): VITAMINB12, FOLATE, FERRITIN, TIBC, IRON, RETICCTPCT in the last 72 hours. Urine analysis:    Component Value Date/Time   COLORURINE STRAW (A) 06/02/2021 1801   APPEARANCEUR CLEAR (A) 06/02/2021 1801   LABSPEC 1.005 06/02/2021 1801   PHURINE 6.0 06/02/2021 1801   GLUCOSEU 50 (A) 06/02/2021 1801   HGBUR SMALL (A) 06/02/2021 1801   BILIRUBINUR NEGATIVE 06/02/2021 1801   KETONESUR NEGATIVE 06/02/2021 1801   PROTEINUR 100 (A) 06/02/2021 1801   NITRITE NEGATIVE 06/02/2021 1801   LEUKOCYTESUR NEGATIVE 06/02/2021 1801   Sepsis Labs: @LABRCNTIP (procalcitonin:4,lacticidven:4) ) Recent Results (from the past 240 hour(s))  Resp Panel by RT-PCR (Flu A&B, Covid) Nasopharyngeal Swab     Status: None   Collection Time: 01/18/22  4:08 PM   Specimen: Nasopharyngeal Swab; Nasopharyngeal(NP) swabs in vial transport medium  Result Value Ref Range Status   SARS Coronavirus 2 by RT PCR NEGATIVE NEGATIVE Final    Comment: (NOTE) SARS-CoV-2 target nucleic acids are NOT DETECTED.  The SARS-CoV-2 RNA is generally detectable in upper respiratory specimens during the acute phase of infection. The lowest concentration of SARS-CoV-2 viral copies this assay can detect is 138 copies/mL. A negative result does not preclude SARS-Cov-2 infection and should not be used as the sole basis for treatment or other patient management decisions. A negative result may occur with  improper specimen collection/handling, submission of specimen other than nasopharyngeal swab, presence of viral mutation(s) within the areas targeted by this assay, and inadequate number of viral copies(<138 copies/mL). A negative result must be combined with clinical observations, patient history, and epidemiological information. The expected result is Negative.  Fact Sheet for Patients:   EntrepreneurPulse.com.au  Fact Sheet for Healthcare Providers:  IncredibleEmployment.be  This test is no t yet approved or cleared by the [place] FDA and  has been authorized for detection and/or diagnosis of SARS-CoV-2 by FDA under an Emergency Use Authorization (EUA). This EUA will remain  in effect (meaning this test can be used) for the duration of the COVID-19 declaration under Section 564(b)(1) of the Act, 21 U.S.C.section 360bbb-3(b)(1), unless the authorization is terminated  or revoked sooner.       Influenza A by PCR NEGATIVE  NEGATIVE Final   Influenza B by PCR NEGATIVE NEGATIVE Final    Comment: (NOTE) The Xpert Xpress SARS-CoV-2/FLU/RSV plus assay is intended as an aid in the diagnosis of influenza from Nasopharyngeal swab specimens and should not be used as a sole basis for treatment. Nasal washings and aspirates are unacceptable for Xpert Xpress SARS-CoV-2/FLU/RSV testing.  Fact Sheet for Patients: EntrepreneurPulse.com.au  Fact Sheet for Healthcare Providers: IncredibleEmployment.be  This test is not yet approved or cleared by the [place] FDA and has been authorized for detection and/or diagnosis of SARS-CoV-2 by FDA under an Emergency Use Authorization (EUA). This EUA will remain in effect (meaning this test can be used) for the duration of the COVID-19 declaration under Section 564(b)(1) of the Act, 21 U.S.C. section 360bbb-3(b)(1), unless the authorization is terminated or revoked.  Performed at [facility], [address] [place], [place]  [address]      Radiological Exams on Admission: No results found.    Assessment/Plan Principal Problem:   Hypertensive urgency Active Problems:   Legally blind   Multiple sclerosis (HCC)   PAF (paroxysmal atrial fibrillation) (HCC)   Stroke (HCC)   HLD (hyperlipidemia)   HTN (hypertension)   Hypertensive  urgency and HTN: HTN 224/124, which improved with 206/111 --> SBP 160 after giving 10 mg of labetalol and 10 mg of IV hydralazine.  Patient is basically asymptomatic.  Patient received 50 mg of Cozaar and 12.5 mg of HCTZ in ED.   -place in tele bed for obs -will start IV hydralazine 5 mg as needed every 2 hours of blood pressure > 165 -will continue home Losartan-HCTZ 50-25 mg daily -Metoprolol 25 mg bid -will add amlodipin 5 mg daily  Legally blind: -Fall precaution  Multiple sclerosis ([place]): Seems to be well controlled. -Continue home Tecfider  PAF (paroxysmal atrial fibrillation) (HCC) -Xarelto -Metoprolol, flecainide  History of stroke (HCC) -Lipitor -Patient is on Xarelto for A-fib  HLD (hyperlipidemia) -Lipitor      DVT ppx: SQ Lovenox  Code Status: Full code  Family Communication:   Yes, patient's husbnad by phone  Disposition Plan:  Anticipate discharge back to previous environment  Consults called:  none  Admission status and Level of care: Telemetry Medical:     for obs SDU/inpation         Severity of Illness:  The appropriate patient status for this patient is OBSERVATION. Observation status is judged to be reasonable and necessary in order to provide the required intensity of service to ensure the patient's safety. The patient's presenting symptoms, physical exam findings, and initial radiographic and laboratory data in the context of their medical condition is felt to place them at decreased risk for further clinical deterioration. Furthermore, it is anticipated that the patient will be medically stable for discharge from the hospital within 2 midnights of admission.        Date of Service 01/18/2022    [facility]   If 7PM-7AM, please contact night-coverage [email or web address] 01/18/2022, 5:20 PM

## 2022-01-19 DIAGNOSIS — I480 Paroxysmal atrial fibrillation: Secondary | ICD-10-CM

## 2022-01-19 DIAGNOSIS — G35 Multiple sclerosis: Secondary | ICD-10-CM

## 2022-01-19 DIAGNOSIS — E7849 Other hyperlipidemia: Secondary | ICD-10-CM | POA: Diagnosis not present

## 2022-01-19 DIAGNOSIS — I160 Hypertensive urgency: Secondary | ICD-10-CM | POA: Diagnosis not present

## 2022-01-19 DIAGNOSIS — H548 Legal blindness, as defined in USA: Secondary | ICD-10-CM

## 2022-01-19 MED ORDER — AMLODIPINE BESYLATE 10 MG PO TABS
10.0000 mg | ORAL_TABLET | Freq: Every day | ORAL | Status: DC
Start: 2022-01-19 — End: 2022-01-19
  Administered 2022-01-19: 08:00:00 10 mg via ORAL
  Filled 2022-01-19: qty 1

## 2022-01-19 MED ORDER — FLECAINIDE ACETATE 100 MG PO TABS
100.0000 mg | ORAL_TABLET | Freq: Two times a day (BID) | ORAL | Status: DC
  Filled 2022-01-19: qty 1

## 2022-01-19 MED ORDER — AMLODIPINE BESYLATE 10 MG PO TABS: 10.0000 mg | ORAL_TABLET | Freq: Every day | ORAL | 0 refills | Status: AC

## 2022-01-19 MED ORDER — IBUPROFEN 400 MG PO TABS
400.0000 mg | ORAL_TABLET | Freq: Once | ORAL | Status: DC
  Filled 2022-01-19: qty 1

## 2022-01-19 NOTE — Progress Notes (Incomplete)
PROGRESS NOTE  LOREY Shepard    DOB: 1966/07/01, 56 y.o.  [id number]    Code Status: Full Code   DOA: 01/18/2022   LOS: 0   Brief hospital course  [name] is a 56 y.o. female with a PMH significant for ***. They presented from *** to the ED on 01/18/2022 with *** x *** days. *** In the ED, it was found that they had ***.  They were treated with ***.  Patient was admitted to medicine service for further workup and management of *** as outlined in detail below.  01/19/22 -***  Assessment & Plan  Principal Problem:   Hypertensive urgency Active Problems:   Legally blind   Multiple sclerosis ([place])   PAF (paroxysmal atrial fibrillation) (HCC)   Stroke (HCC)   HLD (hyperlipidemia)   HTN (hypertension)  *** -   *** -   *** -   *** -   *** -  Body mass index is 29.69 kg/m.  VTE ppx:  rivaroxaban (XARELTO) tablet 20 mg   Diet:     Diet   Diet Heart Room service appropriate? Yes; Fluid consistency: Thin   Subjective 01/19/22    Pt reports ***   Objective   Vitals:   01/19/22 0013 01/19/22 0406 01/19/22 0548 01/19/22 0729  BP: (!) 171/90 (!) 185/112 (!) 174/89 (!) 151/88  Pulse: 65 73 72 73  Resp: 16 14  16   Temp: 98.5 F (36.9 C)   99 F (37.2 C)  TempSrc:      SpO2: 100% 100% 100% 100%  Weight:      Height:       No intake or output data in the 24 hours ending 01/19/22 0730 Filed Weights   01/18/22 1303  Weight: 68.9 kg     Physical Exam: *** General: awake, alert, NAD HEENT: atraumatic, clear conjunctiva, anicteric sclera, MMM, hearing grossly normal Respiratory: normal respiratory effort. Cardiovascular: normal S1/S2, RRR, no JVD, murmurs, quick capillary refill  Gastrointestinal: soft, NT, ND Nervous: A&O x3. no gross focal neurologic deficits, normal speech Extremities: moves all equally, no edema, normal tone Skin: dry, intact, normal temperature, normal color. No rashes, lesions or ulcers on exposed skin Psychiatry: normal  mood, congruent affect  Labs   I have personally reviewed the following labs and imaging studies CBC    Component Value Date/Time   WBC 7.1 01/18/2022 1607   RBC 4.62 01/18/2022 1607   HGB 13.7 01/18/2022 1607   HCT 41.7 01/18/2022 1607   PLT 216 01/18/2022 1607   MCV 90.3 01/18/2022 1607   MCH 29.7 01/18/2022 1607   MCHC 32.9 01/18/2022 1607   RDW 13.3 01/18/2022 1607   LYMPHSABS 1.6 06/02/2021 1801   MONOABS 0.5 06/02/2021 1801   EOSABS 0.1 06/02/2021 1801   BASOSABS 0.0 06/02/2021 1801   BMP Latest Ref Rng & Units 01/18/2022 06/02/2021 11/05/2020  Glucose 70 - 99 mg/dL 93 140(H) 120(H)  BUN 6 - 20 mg/dL 15 26(H) 8  Creatinine 0.44 - 1.00 mg/dL 0.78 1.00 0.59  Sodium 135 - 145 mmol/L 142 138 140  Potassium 3.5 - 5.1 mmol/L 3.5 3.3(L) 3.6  Chloride 98 - 111 mmol/L 101 101 102  CO2 22 - 32 mmol/L 30 26 28   Calcium 8.9 - 10.3 mg/dL 10.2 9.7 9.3    No results found.  Disposition Plan & Communication  Patient status: Observation  Admitted From: {From:23814} Planned disposition location: {PLAN; DISPOSITION:26386} Anticipated discharge date: *** pending ***  Family Communication: ***  Author: Richarda Osmond, DO [facility] 01/19/2022, 7:30 AM   Available by Epic secure chat 7AM-7PM. If 7PM-7AM, please contact night-coverage.  [facility] contact information found on [email or web address].

## 2022-01-19 NOTE — Discharge Summary (Signed)
? ? ?Physician Discharge Summary  ?Patient: [name] PJA:[id number] DOB: Nov 15, 1966   Code Status: Full Code ?Admit date: 01/18/2022 ?Discharge date: 01/19/2022 ?Disposition: Home, No home health services recommended ?PCP: Sherald Barge, DO ? ?Recommendations for Outpatient Follow-up:  ?Follow up with PCP within 1-2 weeks ?Monitor blood pressure on current regimen as listed below. BP in 120-150s systolics on day of discharge. She was asymptomatic ? ?Discharge Diagnoses:  ?Principal Problem: ?  Hypertensive urgency ?Active Problems: ?  Legally blind ?  Multiple sclerosis (HCC) ?  PAF (paroxysmal atrial fibrillation) (HCC) ?  Stroke [facility]) ?  HLD (hyperlipidemia) ?  HTN (hypertension) ? ?Brief Hospital Course Summary: ?Pt presented due to recommendation of her PCP for severe ranges blood pressures. She was asymptomatic. Systolics in low 200s on presentation. No evidence of end-organ damage.  ?Her medications were modified and titrated up throughout admission with close monitoring. ?Her BP became better controlled to 120-150s systolics on day of discharge  ? ?Discharge Condition: Good, improved ?Recommended discharge diet: Regular healthy diet ? ?Consultations: ?None ? ?Procedures/Studies: ?None  ? ? ?Allergies as of 01/19/2022   ? ?   Reactions  ? Strawberry (diagnostic) Swelling  ? Carbamazepine Rash  ? Carbamazepine Itching, Rash  ? ?  ? ?  ?Medication List  ?  ? ?STOP taking these medications   ? ?docusate sodium 100 MG capsule ?Commonly known as: COLACE ?  ?pantoprazole 40 MG tablet ?Commonly known as: PROTONIX ?  ?senna-docusate 8.6-50 MG tablet ?Commonly known as: Senokot-S ?  ? ?  ? ?TAKE these medications   ? ?amLODipine 10 MG tablet ?Commonly known as: NORVASC ?Take 1 tablet (10 mg total) by mouth daily. ?Start taking on: January 20, 2022 ?  ?atorvastatin 40 MG tablet ?Commonly known as: LIPITOR ?Take 1 tablet (40 mg total) by mouth daily at 6 PM. ?  ?Dimethyl Fumarate 240 MG Cpdr ?Take 240 mg by mouth 2  (two) times daily. ?  ?famotidine 20 MG tablet ?Commonly known as: PEPCID ?Take 20 mg by mouth 2 (two) times daily. ?  ?flecainide 100 MG tablet ?Commonly known as: TAMBOCOR ?Take 100 mg by mouth 2 (two) times daily. ?  ?losartan-hydrochlorothiazide 50-12.5 MG tablet ?Commonly known as: HYZAAR ?Take 1 tablet by mouth daily. ?  ?magnesium oxide 400 MG tablet ?Commonly known as: MAG-OX ?Take 400 mg by mouth daily. ?  ?metoprolol tartrate 25 MG tablet ?Commonly known as: LOPRESSOR ?Take 2 tablets (50 mg total) by mouth 2 (two) times daily. Be sure to follow-up with your doctor and get more refills if this is working. ?  ?potassium chloride 10 MEQ tablet ?Commonly known as: KLOR-CON ?Take 10 mEq by mouth daily. ?  ?rivaroxaban 20 MG Tabs tablet ?Commonly known as: XARELTO ?Take 20 mg by mouth daily. ?  ? ?  ? ? ? ?Subjective   ?Pt reports feeling well. She had a mild headache which resolved with tylenol. ?Objective  ?Blood pressure 120/70, pulse 62, temperature 98.9 ?F (37.2 ?C), temperature source Oral, resp. rate 16, height 5' (1.524 m), weight 68.9 kg, SpO2 100 %.  ? ?General: Pt is alert, awake, not in acute distress ?Cardiovascular: RRR, S1/S2 +, no rubs, no gallops ?Respiratory: CTA bilaterally, no wheezing, no rhonchi ?Abdominal: Soft, NT, ND, bowel sounds + ?Extremities: no edema, no cyanosis ? ? ?The results of significant diagnostics from this hospitalization (including imaging, microbiology, ancillary and laboratory) are listed below for reference.  ? ?Imaging studies: ?No results found. ? ?Labs: ?Basic Metabolic  Panel: ?Recent Labs  ?Lab 01/18/22 ?1607  ?NA 142  ?K 3.5  ?CL 101  ?CO2 30  ?GLUCOSE 93  ?BUN 15  ?CREATININE 0.78  ?CALCIUM 10.2  ? ?CBC: ?Recent Labs  ?Lab 01/18/22 ?1607  ?WBC 7.1  ?HGB 13.7  ?HCT 41.7  ?MCV 90.3  ?PLT 216  ? ?Microbiology: ?none ? ?Time coordinating discharge: Over 30 minutes ? ?Leeroy Bock, MD  ?[facility] ?01/19/2022, 4:10 PM ? ?

## 2022-01-19 NOTE — Progress Notes (Signed)
Patient being discharged home. IV removed. Went over discharge instructions and medications with patient and patients husband. All questions answered. Patient going POV with husband.  ?
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
## 2022-01-19 NOTE — Discharge Instructions (Signed)
Please follow up with your primary doctor within a week to monitor your blood pressure. You were started on amlodipine in addition to your home medications for blood pressure treatment.  ?I recommend getting a home BP cuff if you do not already have one.  ?Go to [email or web address] to find a reliable brand/type of blood pressure cuff to use at home ?

## 2022-02-11 ENCOUNTER — Other Ambulatory Visit: Payer: Self-pay | Admitting: Student in an Organized Health Care Education/Training Program

## 2022-08-17 ENCOUNTER — Other Ambulatory Visit (HOSPITAL_COMMUNITY): Payer: Self-pay | Admitting: Neurology

## 2022-08-17 ENCOUNTER — Other Ambulatory Visit: Payer: Self-pay | Admitting: Neurology

## 2022-08-17 DIAGNOSIS — G35 Multiple sclerosis: Secondary | ICD-10-CM

## 2022-09-01 ENCOUNTER — Ambulatory Visit: Payer: Medicare (Managed Care)

## 2022-09-04 ENCOUNTER — Encounter (HOSPITAL_COMMUNITY): Payer: Self-pay

## 2022-09-04 ENCOUNTER — Other Ambulatory Visit (HOSPITAL_COMMUNITY): Payer: Medicare (Managed Care)

## 2022-09-12 ENCOUNTER — Other Ambulatory Visit: Payer: Self-pay | Admitting: Neurology

## 2022-09-12 DIAGNOSIS — G35 Multiple sclerosis: Secondary | ICD-10-CM

## 2022-09-26 ENCOUNTER — Ambulatory Visit (HOSPITAL_COMMUNITY)
Admission: RE | Admit: 2022-09-26 | Discharge: 2022-09-26 | Disposition: A | Discharge status: Home / Self Care | Payer: Medicare (Managed Care) | Source: Ambulatory Visit | Attending: Neurology | Admitting: Neurology

## 2022-09-26 DIAGNOSIS — G35 Multiple sclerosis: Secondary | ICD-10-CM | POA: Insufficient documentation

## 2022-09-26 MED ORDER — GADOBUTROL 1 MMOL/ML IV SOLN
7.0000 mL | Freq: Once | INTRAVENOUS | Status: AC | PRN
  Administered 2022-09-26: 7 mL via INTRAVENOUS

## 2022-10-03 ENCOUNTER — Other Ambulatory Visit: Payer: Medicare (Managed Care)

## 2022-10-06 ENCOUNTER — Ambulatory Visit
Admission: RE | Admit: 2022-10-06 | Discharge: 2022-10-06 | Disposition: A | Discharge status: Home / Self Care | Payer: Medicare (Managed Care) | Source: Ambulatory Visit | Attending: Neurology | Admitting: Neurology

## 2022-10-06 DIAGNOSIS — G35 Multiple sclerosis: Secondary | ICD-10-CM

## 2022-10-06 MED ORDER — GADOBENATE DIMEGLUMINE 529 MG/ML IV SOLN
15.0000 mL | Freq: Once | INTRAVENOUS | Status: AC | PRN
  Administered 2022-10-06: 15 mL via INTRAVENOUS

## 2023-12-19 ENCOUNTER — Ambulatory Visit: Payer: 59 | Attending: Family Medicine

## 2023-12-19 DIAGNOSIS — G8929 Other chronic pain: Secondary | ICD-10-CM | POA: Diagnosis present

## 2023-12-19 DIAGNOSIS — M25511 Pain in right shoulder: Secondary | ICD-10-CM | POA: Diagnosis present

## 2023-12-19 DIAGNOSIS — M6281 Muscle weakness (generalized): Secondary | ICD-10-CM | POA: Insufficient documentation
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
## 2023-12-20 NOTE — Addendum Note (Signed)
Addended by: Georgia Duff IV on: 12/20/2023 10:02 AM   Modules accepted: Orders

## 2023-12-27 ENCOUNTER — Ambulatory Visit: Payer: 59 | Attending: Family Medicine

## 2023-12-27 DIAGNOSIS — G8929 Other chronic pain: Secondary | ICD-10-CM | POA: Insufficient documentation

## 2023-12-27 DIAGNOSIS — M6281 Muscle weakness (generalized): Secondary | ICD-10-CM | POA: Diagnosis present

## 2023-12-27 DIAGNOSIS — M25511 Pain in right shoulder: Secondary | ICD-10-CM | POA: Insufficient documentation
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
## 2024-01-01 ENCOUNTER — Ambulatory Visit: Payer: 59

## 2024-01-01 DIAGNOSIS — G8929 Other chronic pain: Secondary | ICD-10-CM

## 2024-01-01 DIAGNOSIS — M25511 Pain in right shoulder: Secondary | ICD-10-CM | POA: Diagnosis not present

## 2024-01-01 DIAGNOSIS — M6281 Muscle weakness (generalized): Secondary | ICD-10-CM
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
## 2024-01-03 ENCOUNTER — Ambulatory Visit: Payer: 59

## 2024-01-03 DIAGNOSIS — G8929 Other chronic pain: Secondary | ICD-10-CM

## 2024-01-03 DIAGNOSIS — M25511 Pain in right shoulder: Secondary | ICD-10-CM | POA: Diagnosis not present

## 2024-01-03 DIAGNOSIS — M6281 Muscle weakness (generalized): Secondary | ICD-10-CM
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
## 2024-01-08 ENCOUNTER — Ambulatory Visit: Payer: 59

## 2024-01-08 DIAGNOSIS — M6281 Muscle weakness (generalized): Secondary | ICD-10-CM

## 2024-01-08 DIAGNOSIS — M25511 Pain in right shoulder: Secondary | ICD-10-CM | POA: Diagnosis not present

## 2024-01-08 DIAGNOSIS — G8929 Other chronic pain: Secondary | ICD-10-CM
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
## 2024-01-10 ENCOUNTER — Ambulatory Visit: Payer: 59

## 2024-01-15 ENCOUNTER — Ambulatory Visit: Payer: 59

## 2024-01-15 DIAGNOSIS — G8929 Other chronic pain: Secondary | ICD-10-CM

## 2024-01-15 DIAGNOSIS — M25511 Pain in right shoulder: Secondary | ICD-10-CM | POA: Diagnosis not present

## 2024-01-15 DIAGNOSIS — M6281 Muscle weakness (generalized): Secondary | ICD-10-CM
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
## 2024-01-17 ENCOUNTER — Ambulatory Visit: Payer: 59

## 2024-01-17 DIAGNOSIS — M25511 Pain in right shoulder: Secondary | ICD-10-CM | POA: Diagnosis not present

## 2024-01-17 DIAGNOSIS — G8929 Other chronic pain: Secondary | ICD-10-CM

## 2024-01-17 DIAGNOSIS — M6281 Muscle weakness (generalized): Secondary | ICD-10-CM
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
## 2024-01-29 ENCOUNTER — Ambulatory Visit: Payer: 59 | Attending: Family Medicine

## 2024-01-29 DIAGNOSIS — M25511 Pain in right shoulder: Secondary | ICD-10-CM | POA: Insufficient documentation

## 2024-01-29 DIAGNOSIS — G8929 Other chronic pain: Secondary | ICD-10-CM | POA: Diagnosis present

## 2024-01-29 DIAGNOSIS — M6281 Muscle weakness (generalized): Secondary | ICD-10-CM | POA: Diagnosis present
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
## 2024-01-31 ENCOUNTER — Ambulatory Visit: Payer: 59

## 2024-01-31 DIAGNOSIS — M25511 Pain in right shoulder: Secondary | ICD-10-CM | POA: Diagnosis not present

## 2024-01-31 DIAGNOSIS — M6281 Muscle weakness (generalized): Secondary | ICD-10-CM

## 2024-01-31 DIAGNOSIS — G8929 Other chronic pain: Secondary | ICD-10-CM
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
## 2024-02-05 ENCOUNTER — Ambulatory Visit: Payer: 59

## 2024-02-05 DIAGNOSIS — M25511 Pain in right shoulder: Secondary | ICD-10-CM | POA: Diagnosis not present

## 2024-02-05 DIAGNOSIS — G8929 Other chronic pain: Secondary | ICD-10-CM

## 2024-02-05 DIAGNOSIS — M6281 Muscle weakness (generalized): Secondary | ICD-10-CM
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
## 2024-02-07 ENCOUNTER — Ambulatory Visit: Payer: 59

## 2024-02-07 DIAGNOSIS — M6281 Muscle weakness (generalized): Secondary | ICD-10-CM

## 2024-02-07 DIAGNOSIS — G8929 Other chronic pain: Secondary | ICD-10-CM

## 2024-02-07 DIAGNOSIS — M25511 Pain in right shoulder: Secondary | ICD-10-CM | POA: Diagnosis not present
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
## 2024-02-12 ENCOUNTER — Ambulatory Visit: Payer: 59

## 2024-02-12 DIAGNOSIS — M6281 Muscle weakness (generalized): Secondary | ICD-10-CM

## 2024-02-12 DIAGNOSIS — G8929 Other chronic pain: Secondary | ICD-10-CM

## 2024-02-12 DIAGNOSIS — M25511 Pain in right shoulder: Secondary | ICD-10-CM | POA: Diagnosis not present
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
## 2024-02-14 ENCOUNTER — Ambulatory Visit: Payer: 59

## 2024-02-19 ENCOUNTER — Ambulatory Visit: Payer: 59

## 2024-02-21 ENCOUNTER — Ambulatory Visit: Payer: 59
